# Patient Record
Sex: Male | Born: 1975 | Race: Black or African American | Hispanic: No | Marital: Single | Smoking: Current every day smoker
Health system: Southern US, Community
[De-identification: ages and names within clinical notes are randomized; demographics above are authoritative.]

## PROBLEM LIST (undated history)

## (undated) DIAGNOSIS — I10 Essential (primary) hypertension: Secondary | ICD-10-CM

---

## 1999-02-21 ENCOUNTER — Emergency Department (HOSPITAL_COMMUNITY): Admission: EM | Admit: 1999-02-21 | Discharge: 1999-02-21 | Payer: Self-pay | Admitting: Emergency Medicine

## 2002-05-08 ENCOUNTER — Emergency Department (HOSPITAL_COMMUNITY): Admission: EM | Admit: 2002-05-08 | Discharge: 2002-05-08 | Payer: Self-pay | Admitting: *Deleted

## 2002-05-08 ENCOUNTER — Encounter: Payer: Self-pay | Admitting: Emergency Medicine

## 2002-09-29 ENCOUNTER — Emergency Department (HOSPITAL_COMMUNITY): Admission: EM | Admit: 2002-09-29 | Discharge: 2002-09-29 | Payer: Self-pay

## 2002-09-29 ENCOUNTER — Encounter: Payer: Self-pay | Admitting: Emergency Medicine

## 2002-11-06 ENCOUNTER — Emergency Department (HOSPITAL_COMMUNITY): Admission: EM | Admit: 2002-11-06 | Discharge: 2002-11-06 | Payer: Self-pay | Admitting: Emergency Medicine

## 2002-11-06 ENCOUNTER — Emergency Department (HOSPITAL_COMMUNITY): Admission: EM | Admit: 2002-11-06 | Discharge: 2002-11-07 | Payer: Self-pay | Admitting: Emergency Medicine

## 2002-11-07 ENCOUNTER — Ambulatory Visit (HOSPITAL_BASED_OUTPATIENT_CLINIC_OR_DEPARTMENT_OTHER): Admission: RE | Admit: 2002-11-07 | Discharge: 2002-11-07 | Payer: Self-pay | Admitting: *Deleted

## 2003-11-04 ENCOUNTER — Emergency Department (HOSPITAL_COMMUNITY): Admission: AD | Admit: 2003-11-04 | Discharge: 2003-11-04 | Payer: Self-pay | Admitting: Family Medicine

## 2004-10-07 ENCOUNTER — Emergency Department (HOSPITAL_COMMUNITY): Admission: EM | Admit: 2004-10-07 | Discharge: 2004-10-08 | Payer: Self-pay | Admitting: Emergency Medicine

## 2004-11-15 ENCOUNTER — Emergency Department (HOSPITAL_COMMUNITY): Admission: EM | Admit: 2004-11-15 | Discharge: 2004-11-16 | Payer: Self-pay | Admitting: Emergency Medicine

## 2004-12-02 ENCOUNTER — Emergency Department (HOSPITAL_COMMUNITY): Admission: EM | Admit: 2004-12-02 | Discharge: 2004-12-02 | Payer: Self-pay | Admitting: Family Medicine

## 2005-01-01 ENCOUNTER — Encounter: Payer: Self-pay | Admitting: Emergency Medicine

## 2005-01-01 ENCOUNTER — Inpatient Hospital Stay (HOSPITAL_COMMUNITY): Admission: AD | Admit: 2005-01-01 | Discharge: 2005-01-04 | Payer: Self-pay | Admitting: *Deleted

## 2005-01-01 ENCOUNTER — Ambulatory Visit: Payer: Self-pay | Admitting: Internal Medicine

## 2006-09-14 ENCOUNTER — Emergency Department (HOSPITAL_COMMUNITY): Admission: EM | Admit: 2006-09-14 | Discharge: 2006-09-14 | Payer: Self-pay | Admitting: Family Medicine

## 2006-11-15 ENCOUNTER — Emergency Department (HOSPITAL_COMMUNITY): Admission: EM | Admit: 2006-11-15 | Discharge: 2006-11-15 | Payer: Self-pay | Admitting: Emergency Medicine

## 2006-11-16 ENCOUNTER — Emergency Department (HOSPITAL_COMMUNITY): Admission: EM | Admit: 2006-11-16 | Discharge: 2006-11-16 | Payer: Self-pay | Admitting: Emergency Medicine

## 2006-11-21 ENCOUNTER — Emergency Department (HOSPITAL_COMMUNITY): Admission: EM | Admit: 2006-11-21 | Discharge: 2006-11-21 | Payer: Self-pay

## 2007-02-27 ENCOUNTER — Emergency Department (HOSPITAL_COMMUNITY): Admission: EM | Admit: 2007-02-27 | Discharge: 2007-02-27 | Payer: Self-pay | Admitting: Family Medicine

## 2007-03-05 ENCOUNTER — Emergency Department (HOSPITAL_COMMUNITY): Admission: EM | Admit: 2007-03-05 | Discharge: 2007-03-05 | Payer: Self-pay | Admitting: Emergency Medicine

## 2007-03-06 ENCOUNTER — Emergency Department (HOSPITAL_COMMUNITY): Admission: EM | Admit: 2007-03-06 | Discharge: 2007-03-06 | Payer: Self-pay | Admitting: *Deleted

## 2007-04-18 ENCOUNTER — Emergency Department (HOSPITAL_COMMUNITY): Admission: EM | Admit: 2007-04-18 | Discharge: 2007-04-18 | Payer: Self-pay | Admitting: Emergency Medicine

## 2007-04-27 ENCOUNTER — Emergency Department (HOSPITAL_COMMUNITY): Admission: EM | Admit: 2007-04-27 | Discharge: 2007-04-27 | Payer: Self-pay | Admitting: Emergency Medicine

## 2007-09-14 ENCOUNTER — Emergency Department (HOSPITAL_COMMUNITY): Admission: EM | Admit: 2007-09-14 | Discharge: 2007-09-14 | Payer: Self-pay | Admitting: Emergency Medicine

## 2008-11-20 ENCOUNTER — Emergency Department (HOSPITAL_COMMUNITY): Admission: EM | Admit: 2008-11-20 | Discharge: 2008-11-20 | Payer: Self-pay | Admitting: Emergency Medicine

## 2008-12-14 ENCOUNTER — Emergency Department (HOSPITAL_COMMUNITY): Admission: EM | Admit: 2008-12-14 | Discharge: 2008-12-14 | Payer: Self-pay | Admitting: Emergency Medicine

## 2008-12-16 ENCOUNTER — Emergency Department (HOSPITAL_COMMUNITY): Admission: EM | Admit: 2008-12-16 | Discharge: 2008-12-16 | Payer: Self-pay | Admitting: Emergency Medicine

## 2010-11-22 LAB — GC/CHLAMYDIA PROBE AMP, GENITAL
Chlamydia, DNA Probe: NEGATIVE
GC Probe Amp, Genital: NEGATIVE

## 2010-11-23 LAB — DIFFERENTIAL
Basophils Absolute: 0.1 10*3/uL (ref 0.0–0.1)
Eosinophils Relative: 4 % (ref 0–5)
Lymphocytes Relative: 36 % (ref 12–46)

## 2010-11-23 LAB — URINALYSIS, ROUTINE W REFLEX MICROSCOPIC
Bilirubin Urine: NEGATIVE
Glucose, UA: NEGATIVE mg/dL
Hgb urine dipstick: NEGATIVE
Ketones, ur: NEGATIVE mg/dL
Nitrite: NEGATIVE
Protein, ur: NEGATIVE mg/dL
Specific Gravity, Urine: 1.027 (ref 1.005–1.030)
Urobilinogen, UA: 1 mg/dL (ref 0.0–1.0)
pH: 6 (ref 5.0–8.0)

## 2010-11-23 LAB — CBC
HCT: 48.8 % (ref 39.0–52.0)
Platelets: 288 10*3/uL (ref 150–400)
RDW: 12.8 % (ref 11.5–15.5)

## 2010-11-23 LAB — POCT I-STAT, CHEM 8
BUN: 7 mg/dL (ref 6–23)
Hemoglobin: 18 g/dL — ABNORMAL HIGH (ref 13.0–17.0)
Sodium: 141 mEq/L (ref 135–145)
TCO2: 26 mmol/L (ref 0–100)

## 2010-12-30 NOTE — Discharge Summary (Signed)
NAME:  Paul Osborne, Paul Osborne NO.:  [id number]   MEDICAL RECORD NO.:  [id number]          PATIENT TYPE:  INP   LOCATION:  5702                         FACILITY:  [facility]   PHYSICIAN:  Gertha Calkin, M.D.DATE OF BIRTH:  Jan 04, 1976   DATE OF ADMISSION:  01/01/2005  DATE OF DISCHARGE:  01/04/2005                                 DISCHARGE SUMMARY   PRIMARY CARE PHYSICIAN:  Unassigned.   DISCHARGE DIAGNOSES:  1. Active pulmonary tuberculosis.  2. Polysubstance abuse.  3. Tobacco abuse.     MEDICATIONS:  1. Rifampin 600 mg p.o. daily.  2. Ethambutol 1200 mg p.o. daily.  3. Pyrazinamide 1500 mg p.o. daily.  4. Pyridoxine 50 mg p.o. daily.  5. Isoniazid 300 mg p.o. daily.     FOLLOW UP:  With the [facility] regarding DOT therapy for his  tuberculosis.   DIAGNOSTIC STUDIES:  1. AFB smear 4+ positive for acid fast bacilli.  2. HIV is nonreactive.  3. RPR is nonreactive.  4. Liver function tests were AST 14, ALT less than 8, alkaline phosphatase      73, total bilirubin of 1.2, (normal).  Albumin low at 2.9.     HOSPITAL COURSE:  Please see H&P for details of admission.  1. Active pulmonary tuberculosis.  The patient had x-ray findings for      cavitary lesions and smears as results as noted.  Was empirically      placed in isolation from day one.  Once results came back, he was, ID      was consulted and he was placed on a four drug regimen for active      pulmonary tuberculosis.  I have arranged with, I have asked the case      manager arrange outpatient directly observed therapy that he will need      to have for the next six months.  During this hospitalization, he has      tolerated his medications well and therefore we are setting him up for      this outpatient therapy.   1. Polysubstance abuse.  I have discussed with him the risks involved not      only from the substance but also other co-risks involved.  The patient      voices an  understanding and states that he does, will try to quit.     DISPOSITION:  Stable on discharge.  Vitals are normal.  His discharge  laboratories were all within normal limits except for the albumin as listed  above.   FOLLOW UP:  Follow up is to be with Dr. Orvan Falconer in six to eight weeks.  Will give the patient phone numbers and have him make that call to set up  the appointment.  This can also be coordinated with the health clinic if  problems were to arise with his drug regimen.      JD/MEDQ  D:  01/04/2005  T:  01/04/2005  Job:  [id number]   cc:   Orvan Falconer, M.D.   Attention [facility]  [place]
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
## 2010-12-30 NOTE — H&P (Signed)
NAME:  Paul Osborne, Paul Osborne NO.:  [id number]   MEDICAL RECORD NO.:  [id number]          PATIENT TYPE:  INP   LOCATION:  NA                           FACILITY:  [facility]   PHYSICIAN:  Hettie Holstein, D.O.    DATE OF BIRTH:  05/15/1976   DATE OF ADMISSION:  01/01/2005  DATE OF DISCHARGE:                                HISTORY & PHYSICAL   PRIMARY. CARE PHYSICIAN:  Unassigned.   CHIEF COMPLAINT:  I passed out on Friday.   HISTORY OF PRESENT ILLNESS:  Mr. Viviano is a 35 year old African-American  male who has no known chronic medical illnesses.  He does, however, have a  history of polysubstance abuse.  He reports that on Friday he had been at a  concert, drinking a little bit and smoking some marijuana, and passed out  for about 5 seconds.  His family reports that he was on the floor, bumping  his head on the hard wood.  He went home and rested and continued to feel  lightheaded.  He denies any fevers; however, he does report positive night  sweats for quite some time now.  He has had no nausea, vomiting, diarrhea,  chest pain, or shortness of breath.  He has had an unproductive cough,  though he does have a chronic cough which he states is due to bronchitis  that he has been told he has.   In the emergency department, he had a chest x-ray that revealed cavitary  lesions in the upper lobes concerning for tuberculosis; therefore, he was  placed on isolation and is being admitted for TB rule out.   PAST MEDICAL HISTORY:  1.  Polysubstance abuse.  2.  History of bronchitis.  3.  Had abdominal surgery as an infant, but he cannot recall the nature of      this surgery.   MEDICATIONS:  None.   ALLERGIES:  No known drug allergies.   SOCIAL HISTORY:  He is a one-pack-every-three-days smoker.  He drinks  alcohol on the weekends.  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.  He denies crack cocaine, lives alone, and states that he  is single and  monogamous.  He was recently tested for HIV within the past  four months.   FAMILY HISTORY:  Gearldine Shown is 61 with MI.  His mother is 32 years old and  has cervical cancer.  He is not aware of his father's medical history.   REVIEW OF SYSTEMS:  The patient denies any headache.  He states that he does  have some lightheadedness; however, reports that he passed out during a  concert that lasted a few seconds but has not passed out since Friday.  Had  no chest pain, shortness of breath, palpitations.  Otherwise, he has had no  further complaints.  Further Review of Systems unremarkable.  He denies  travel out of the country.  He denies sick contacts.  Denies working in the  health care field.   PHYSICAL EXAMINATION:  GENERAL:  The patient is alert, thin male in no acute  distress.  He is alert and oriented x 3.  VITAL SIGNS:  Stable.  He is afebrile.  CARDIOVASCULAR:  Normal S1 and S2 without murmur or gallop.  PULMONARY:  Normal breath sounds bilaterally, normal effort.  No dullness to  percussion.  ABDOMEN: Soft, nontender.  EXTREMITIES:  No edema.   ASSESSMENT:  1.  Cavitary lesions on chest x-ray, atypical pneumonia.  Rule out      tuberculosis.  2.  Polysubstance abuse.  3.  Lightheadedness.   PLAN:  1.  At this time, we are going to admit Mr. Michels to a respiratory      isolation room.  2.  Rule out TB.  3.  Empirically start him on Avelox to cover atypical sources of pneumonia.   I discussed chest x-ray findings with radiology and will likely follow up  with a chest CT within the next week if he rules out for tuberculosis.      ESS/MEDQ  D:  01/01/2005  T:  01/01/2005  Job:  [id number]

## 2011-05-29 LAB — WOUND CULTURE
Culture: NO GROWTH
Gram Stain: NONE SEEN

## 2011-05-29 LAB — CULTURE, ROUTINE-ABSCESS
Culture: NO GROWTH
Gram Stain: NONE SEEN

## 2011-10-17 ENCOUNTER — Encounter (HOSPITAL_COMMUNITY): Payer: Self-pay | Admitting: Emergency Medicine

## 2011-10-17 ENCOUNTER — Emergency Department (INDEPENDENT_AMBULATORY_CARE_PROVIDER_SITE_OTHER)
Admission: EM | Admit: 2011-10-17 | Discharge: 2011-10-17 | Disposition: A | Discharge status: Home / Self Care | Payer: Self-pay | Source: Home / Self Care | Attending: Emergency Medicine | Admitting: Emergency Medicine

## 2011-10-17 DIAGNOSIS — K5289 Other specified noninfective gastroenteritis and colitis: Secondary | ICD-10-CM

## 2011-10-17 DIAGNOSIS — K529 Noninfective gastroenteritis and colitis, unspecified: Secondary | ICD-10-CM

## 2011-10-17 MED ORDER — ONDANSETRON HCL 4 MG PO TABS: 4.0000 mg | ORAL_TABLET | Freq: Three times a day (TID) | ORAL | Status: AC | PRN

## 2011-10-17 MED ORDER — ACIDOPHILUS PROBIOTIC BLEND PO CAPS: 1.0000 | ORAL_CAPSULE | Freq: Three times a day (TID) | ORAL | Status: DC

## 2011-10-17 MED ORDER — DIPHENOXYLATE-ATROPINE 2.5-0.025 MG PO TABS: 1.0000 | ORAL_TABLET | Freq: Four times a day (QID) | ORAL | Status: AC | PRN

## 2011-10-17 NOTE — ED Notes (Signed)
Report to scarlet, rn

## 2011-10-17 NOTE — Discharge Instructions (Signed)
Go to www.goodrx.com to look up your medications. This will give you a list of where you can find your prescriptions at the most affordable prices.   RESOURCE GUIDE  Insufficient Money for Medicine Contact [employer]:  call "211" or [facility] [phone].  No Primary Care Doctor Call [facility]  [phone] Other agencies that provide inexpensive medical care    [facility]  [phone]    [facility]  [phone]    [facility]  [phone]    [facility]  [phone] [address] [place] [address]    [facility]  [phone]    [facility]  [phone] [facility] [phone]   [address] [place] [address] [place], [place] [address]   [facility]  [facility]     [employer]                          [facility] Dept. [address] [place]                       [address]      [address] [place] [address]   [phone] (After Hours)

## 2011-10-17 NOTE — ED Notes (Signed)
Vomited 5 different times today, diarrhea today, about 4 episodes.  C/o low abdominal pain.  Other family members diagnosed with virus.

## 2011-10-17 NOTE — ED Provider Notes (Signed)
History     CSN: [id number]  Arrival date & time 10/17/11  9562   First MD Initiated Contact with Patient 10/17/11 2037      Chief Complaint  Patient presents with  . Emesis    (Consider location/radiation/quality/duration/timing/severity/associated sxs/prior treatment) HPI Comments: Patient with diffuse, crampy abdominal pain followed by multiple episodes of nbnb emesis and nonbloody, watery diarrhea. States abdominal pain resolves after vomiting/having diarrhea. Reports generalized weakness, and no presyncope, syncope. No fevers, urinary complaints, decreased urine output. Patient has multiple family members with similar illnesses. No recent travel, raw/undercooked foods, questionable leftovers, recent antibiotics, alcohol intake.   ROS as noted in HPI. All other ROS negative.   Patient is a 36 y.o. male presenting with vomiting. The history is provided by the patient. No language interpreter was used.  Emesis  This is a new problem. The current episode started 6 to 12 hours ago. The problem occurs 5 to 10 times per day. The problem has not changed since onset.The emesis has an appearance of stomach contents. There has been no fever. Associated symptoms include abdominal pain and diarrhea. Pertinent negatives include no arthralgias, no chills, no fever, no headaches and no myalgias. Risk factors include ill contacts.    History reviewed. No pertinent past medical history.  History reviewed. No pertinent past surgical history.  No family history on file.  History  Substance Use Topics  . Smoking status: Current Everyday Smoker  . Smokeless tobacco: Not on file  . Alcohol Use: No      Review of Systems  Constitutional: Negative for fever and chills.  Gastrointestinal: Positive for vomiting, abdominal pain and diarrhea.  Musculoskeletal: Negative for myalgias and arthralgias.  Neurological: Negative for headaches.    Allergies  Review of patient's allergies indicates no  known allergies.  Home Medications   Current Outpatient Rx  Name Route Sig Dispense Refill  . DIPHENOXYLATE-ATROPINE 2.5-0.025 MG PO TABS Oral Take 1 tablet by mouth 4 (four) times daily as needed for diarrhea or loose stools. 30 tablet 0  . ONDANSETRON HCL 4 MG PO TABS Oral Take 1 tablet (4 mg total) by mouth every 8 (eight) hours as needed for nausea. 20 tablet 0  . ACIDOPHILUS PROBIOTIC BLEND PO CAPS Oral Take 1 capsule by mouth 3 (three) times daily. 30 capsule 0    BP 142/90  Pulse 94  Resp 16  SpO2 98%  Physical Exam  Nursing note and vitals reviewed. Constitutional: He is oriented to person, place, and time. He appears well-developed and well-nourished.  HENT:  Head: Normocephalic and atraumatic.  Eyes: Conjunctivae and EOM are normal. No scleral icterus.  Neck: Normal range of motion.  Cardiovascular: Normal rate, regular rhythm, normal heart sounds and intact distal pulses.        Refill less than 2 seconds  Pulmonary/Chest: Effort normal and breath sounds normal.  Abdominal: Soft. Normal appearance and bowel sounds are normal. He exhibits no distension. There is no tenderness. There is no rebound, no guarding and no CVA tenderness.  Musculoskeletal: Normal range of motion.  Neurological: He is alert and oriented to person, place, and time.  Skin: Skin is warm and dry.  Psychiatric: He has a normal mood and affect. His behavior is normal.    ED Course  Procedures (including critical care time)  Labs Reviewed - No data to display No results found.   1. Gastroenteritis       MDM  Abdomen benign, patient appears well hydrated. Sending home with  Zofran, Lomotil, oral rehydration.  Luiz Blare, MD 10/17/11 2306

## 2012-08-14 ENCOUNTER — Emergency Department (HOSPITAL_COMMUNITY)
Admission: EM | Admit: 2012-08-14 | Discharge: 2012-08-14 | Disposition: A | Discharge status: Home / Self Care | Payer: Self-pay | Attending: Emergency Medicine | Admitting: Emergency Medicine

## 2012-08-14 ENCOUNTER — Encounter (HOSPITAL_COMMUNITY): Payer: Self-pay | Admitting: Emergency Medicine

## 2012-08-14 DIAGNOSIS — F17200 Nicotine dependence, unspecified, uncomplicated: Secondary | ICD-10-CM | POA: Insufficient documentation

## 2012-08-14 DIAGNOSIS — K047 Periapical abscess without sinus: Secondary | ICD-10-CM | POA: Insufficient documentation

## 2012-08-14 MED ORDER — OXYCODONE-ACETAMINOPHEN 5-325 MG PO TABS
1.0000 | ORAL_TABLET | ORAL | Status: DC | PRN
Start: 2012-08-14 — End: 2013-09-08

## 2012-08-14 MED ORDER — PENICILLIN V POTASSIUM 500 MG PO TABS: 500.0000 mg | ORAL_TABLET | Freq: Four times a day (QID) | ORAL | Status: AC

## 2012-08-14 MED ORDER — OXYCODONE-ACETAMINOPHEN 5-325 MG PO TABS
1.0000 | ORAL_TABLET | Freq: Once | ORAL | Status: AC
  Administered 2012-08-14: 1 via ORAL
  Filled 2012-08-14: qty 1

## 2012-08-14 NOTE — ED Provider Notes (Signed)
History     CSN: [id number]  Arrival date & time 08/14/12  0309   First MD Initiated Contact with Patient 08/14/12 [phone]      Chief Complaint  Patient presents with  . Dental Pain     Patient is a 37 y.o. male presenting with tooth pain. The history is provided by the patient.  Dental PainThe primary symptoms include mouth pain. Primary symptoms do not include fever. Episode onset: several days ago. The symptoms are worsening. The symptoms are new. The symptoms occur constantly.    PMH - none  History reviewed. No pertinent past surgical history.  No family history on file.  History  Substance Use Topics  . Smoking status: Current Every Day Smoker  . Smokeless tobacco: Not on file  . Alcohol Use: No      Review of Systems  Constitutional: Negative for fever.  Gastrointestinal: Negative for vomiting.    Allergies  Review of patient's allergies indicates no known allergies.  Home Medications   Current Outpatient Rx  Name  Route  Sig  Dispense  Refill  . OXYCODONE-ACETAMINOPHEN 5-325 MG PO TABS   Oral   Take 1 tablet by mouth every 4 (four) hours as needed for pain.   5 tablet   0   . PENICILLIN V POTASSIUM 500 MG PO TABS   Oral   Take 1 tablet (500 mg total) by mouth 4 (four) times daily.   40 tablet   0     BP 142/95  Pulse 99  Temp 97.7 F (36.5 C) (Oral)  Resp 18  SpO2 100%  Physical Exam CONSTITUTIONAL: Well developed/well nourished HEAD AND FACE: Normocephalic/atraumatic EYES: EOMI/PERRL ENMT: Mucous membranes moist.  Poor dentition.  No trismus.  Small abscess noted to left lower molar NECK: supple no meningeal signs CV: S1/S2 noted, no murmurs/rubs/gallops noted LUNGS: Lungs are clear to auscultation bilaterally, no apparent distress ABDOMEN: soft, nontender, no rebound or guarding NEURO: Pt is awake/alert, moves all extremitiesx4 EXTREMITIES:full ROM SKIN: warm, color normal  ED Course  Procedures   1. Dental abscess       MDM   Nursing notes including past medical history and social history reviewed and considered in documentation  Start PCN and f/u with dental, pt agreed        Joya Gaskins, MD 08/14/12 [phone]

## 2012-08-14 NOTE — ED Notes (Signed)
PT. REPORTS LEFT LOWER MOLAR PAIN FOR SEVERAL DAYS.

## 2012-08-14 NOTE — ED Notes (Signed)
Pt discharged.Vital signs stable .GCS 15

## 2012-08-31 ENCOUNTER — Emergency Department (HOSPITAL_COMMUNITY): Payer: No Typology Code available for payment source

## 2012-08-31 ENCOUNTER — Emergency Department (HOSPITAL_COMMUNITY)
Admission: EM | Admit: 2012-08-31 | Discharge: 2012-08-31 | Disposition: A | Discharge status: Home / Self Care | Payer: No Typology Code available for payment source | Attending: Emergency Medicine | Admitting: Emergency Medicine

## 2012-08-31 ENCOUNTER — Encounter (HOSPITAL_COMMUNITY): Payer: Self-pay | Admitting: *Deleted

## 2012-08-31 DIAGNOSIS — S025XXA Fracture of tooth (traumatic), initial encounter for closed fracture: Secondary | ICD-10-CM | POA: Insufficient documentation

## 2012-08-31 DIAGNOSIS — Y9389 Activity, other specified: Secondary | ICD-10-CM | POA: Insufficient documentation

## 2012-08-31 DIAGNOSIS — Z23 Encounter for immunization: Secondary | ICD-10-CM | POA: Insufficient documentation

## 2012-08-31 DIAGNOSIS — R413 Other amnesia: Secondary | ICD-10-CM | POA: Insufficient documentation

## 2012-08-31 DIAGNOSIS — S60512A Abrasion of left hand, initial encounter: Secondary | ICD-10-CM

## 2012-08-31 DIAGNOSIS — F17200 Nicotine dependence, unspecified, uncomplicated: Secondary | ICD-10-CM | POA: Insufficient documentation

## 2012-08-31 DIAGNOSIS — Y92410 Unspecified street and highway as the place of occurrence of the external cause: Secondary | ICD-10-CM | POA: Insufficient documentation

## 2012-08-31 DIAGNOSIS — S161XXA Strain of muscle, fascia and tendon at neck level, initial encounter: Secondary | ICD-10-CM

## 2012-08-31 DIAGNOSIS — S139XXA Sprain of joints and ligaments of unspecified parts of neck, initial encounter: Secondary | ICD-10-CM | POA: Insufficient documentation

## 2012-08-31 DIAGNOSIS — IMO0002 Reserved for concepts with insufficient information to code with codable children: Secondary | ICD-10-CM | POA: Insufficient documentation

## 2012-08-31 MED ORDER — OXYCODONE-ACETAMINOPHEN 5-325 MG PO TABS: 1.0000 | ORAL_TABLET | ORAL | Status: DC | PRN

## 2012-08-31 MED ORDER — FLUORESCEIN SODIUM 1 MG OP STRP
1.0000 | ORAL_STRIP | Freq: Once | OPHTHALMIC | Status: AC
  Administered 2012-08-31: 1 via OPHTHALMIC
  Filled 2012-08-31: qty 1

## 2012-08-31 MED ORDER — OXYCODONE-ACETAMINOPHEN 5-325 MG PO TABS
2.0000 | ORAL_TABLET | Freq: Once | ORAL | Status: AC
  Administered 2012-08-31: 2 via ORAL
  Filled 2012-08-31: qty 2

## 2012-08-31 MED ORDER — TETRACAINE HCL 0.5 % OP SOLN
1.0000 [drp] | Freq: Once | OPHTHALMIC | Status: AC
  Administered 2012-08-31: 1 [drp] via OPHTHALMIC
  Filled 2012-08-31: qty 2

## 2012-08-31 MED ORDER — NAPROXEN 500 MG PO TABS: 500.0000 mg | ORAL_TABLET | Freq: Two times a day (BID) | ORAL | Status: DC

## 2012-08-31 MED ORDER — TETANUS-DIPHTH-ACELL PERTUSSIS 5-2.5-18.5 LF-MCG/0.5 IM SUSP
0.5000 mL | Freq: Once | INTRAMUSCULAR | Status: AC
  Administered 2012-08-31: 0.5 mL via INTRAMUSCULAR
  Filled 2012-08-31: qty 0.5

## 2012-08-31 MED ORDER — PENICILLIN V POTASSIUM 500 MG PO TABS: 500.0000 mg | ORAL_TABLET | Freq: Four times a day (QID) | ORAL | Status: DC

## 2012-08-31 NOTE — ED Notes (Signed)
Dr. Hyacinth Meeker removed pt off spine board and C-Collar. Pt denies pain. Pt ambulatory with steady gait to restroom to wash face and hands
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
## 2012-08-31 NOTE — ED Notes (Signed)
Patient transported to CT and XR 
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
## 2013-09-08 ENCOUNTER — Encounter (HOSPITAL_COMMUNITY): Payer: Self-pay | Admitting: Emergency Medicine

## 2013-09-08 ENCOUNTER — Emergency Department (HOSPITAL_COMMUNITY)
Admission: EM | Admit: 2013-09-08 | Discharge: 2013-09-08 | Disposition: A | Discharge status: Home / Self Care | Payer: No Typology Code available for payment source | Attending: Emergency Medicine | Admitting: Emergency Medicine

## 2013-09-08 DIAGNOSIS — J329 Chronic sinusitis, unspecified: Secondary | ICD-10-CM | POA: Insufficient documentation

## 2013-09-08 DIAGNOSIS — R51 Headache: Secondary | ICD-10-CM

## 2013-09-08 DIAGNOSIS — F17200 Nicotine dependence, unspecified, uncomplicated: Secondary | ICD-10-CM | POA: Insufficient documentation

## 2013-09-08 DIAGNOSIS — R519 Headache, unspecified: Secondary | ICD-10-CM

## 2013-09-08 MED ORDER — CETIRIZINE HCL 10 MG PO TABS: 10.0000 mg | ORAL_TABLET | Freq: Every day | ORAL | Status: DC

## 2013-09-08 MED ORDER — KETOROLAC TROMETHAMINE 60 MG/2ML IM SOLN
60.0000 mg | Freq: Once | INTRAMUSCULAR | Status: AC
  Administered 2013-09-08: 60 mg via INTRAMUSCULAR
  Filled 2013-09-08: qty 2

## 2013-09-08 MED ORDER — NAPROXEN 500 MG PO TABS: 500.0000 mg | ORAL_TABLET | Freq: Two times a day (BID) | ORAL | Status: DC

## 2013-09-08 MED ORDER — AMOXICILLIN 500 MG PO CAPS: 1000.0000 mg | ORAL_CAPSULE | Freq: Three times a day (TID) | ORAL | Status: DC

## 2013-09-08 NOTE — ED Provider Notes (Signed)
CSN: [id number]     Arrival date & time 09/08/13  0230 History   First MD Initiated Contact with Patient 09/08/13 0319     Chief Complaint  Patient presents with  . Headache   (Consider location/radiation/quality/duration/timing/severity/associated sxs/prior Treatment) HPI Comments: 38 year old male presents with approximately 3 days of gradual onset of pressure and fullness she feels in the left forehead, left cheek radiating to the left temporal area and the left neck. There is no associated fevers but he has had nasal congestion and some decreased hearing in the left ear. He denies fevers chills nausea or vomiting. Symptoms are persistent, does not get better with over-the-counter medications.  Patient is a 38 y.o. male presenting with headaches. The history is provided by the patient.  Headache   History reviewed. No pertinent past medical history. History reviewed. No pertinent past surgical history. No family history on file. History  Substance Use Topics  . Smoking status: Current Every Day Smoker  . Smokeless tobacco: Not on file  . Alcohol Use: No    Review of Systems  Neurological: Positive for headaches.  All other systems reviewed and are negative.    Allergies  Review of patient's allergies indicates no known allergies.  Home Medications   Current Outpatient Rx  Name  Route  Sig  Dispense  Refill  . amoxicillin (AMOXIL) 500 MG capsule   Oral   Take 2 capsules (1,000 mg total) by mouth 3 (three) times daily.   60 capsule   0   . cetirizine (ZYRTEC ALLERGY) 10 MG tablet   Oral   Take 1 tablet (10 mg total) by mouth daily.   30 tablet   1   . naproxen (NAPROSYN) 500 MG tablet   Oral   Take 1 tablet (500 mg total) by mouth 2 (two) times daily with a meal.   30 tablet   0    BP 136/91  Pulse 57  Temp(Src) 98.1 F (36.7 C) (Oral)  Resp 17  Ht 6\' 8"  (2.032 m)  Wt 163 lb (73.936 kg)  BMI 17.91 kg/m2  SpO2 99% Physical Exam  Nursing note and  vitals reviewed. Constitutional: He appears well-developed and well-nourished. No distress.  HENT:  Head: Normocephalic and atraumatic.  Mouth/Throat: Oropharynx is clear and moist. No oropharyngeal exudate.  Tenderness to palpation over left frontal and maxillary sinuses, nasal passages with swollen turbinates but no discharge, oropharynx clear, tympanic membranes visualized and normal bilaterally  Eyes: Conjunctivae and EOM are normal. Pupils are equal, round, and reactive to light. Right eye exhibits no discharge. Left eye exhibits no discharge. No scleral icterus.  Neck: Normal range of motion. Neck supple. No JVD present. No thyromegaly present.  Cardiovascular: Normal rate, regular rhythm, normal heart sounds and intact distal pulses.  Exam reveals no gallop and no friction rub.   No murmur heard. Pulmonary/Chest: Effort normal and breath sounds normal. No respiratory distress. He has no wheezes. He has no rales.  Abdominal: Soft. Bowel sounds are normal. He exhibits no distension and no mass. There is no tenderness.  Musculoskeletal: Normal range of motion. He exhibits no edema and no tenderness.  Lymphadenopathy:    He has no cervical adenopathy.  Neurological: He is alert. Coordination normal.  Speech is clear, cranial nerves III through XII are intact, memory is intact, strength is normal in all 4 extremities including grips, sensation is intact to light touch and pinprick in all 4 extremities. Correlation normal, no limb ataxia. Normal gait,  Skin: Skin is warm and dry. No rash noted. No erythema.  Psychiatric: He has a normal mood and affect. His behavior is normal.    ED Course  Procedures (including critical care time) Labs Review Labs Reviewed - No data to display Imaging Review No results found.  EKG Interpretation   None       MDM   1. Sinusitis   2. Headache    Normal vital signs except for mild hypertension, likely sinusitis, medications as below, patient  appears stable without any focal neurologic deficits or need for further invasive testing.   Meds given in ED:  Medications  ketorolac (TORADOL) injection 60 mg (not administered)    New Prescriptions   AMOXICILLIN (AMOXIL) 500 MG CAPSULE    Take 2 capsules (1,000 mg total) by mouth 3 (three) times daily.   CETIRIZINE (ZYRTEC ALLERGY) 10 MG TABLET    Take 1 tablet (10 mg total) by mouth daily.   NAPROXEN (NAPROSYN) 500 MG TABLET    Take 1 tablet (500 mg total) by mouth 2 (two) times daily with a meal.        Vida Roller, MD 09/08/13 [phone]

## 2013-09-08 NOTE — ED Notes (Signed)
Pt verbalized understanding of discharge instructions and the need for follow up care outside the emergency room per the resource list provided.

## 2013-09-08 NOTE — ED Notes (Signed)
C/o headache with blurred vision and L eye pain x 3 days.  States when headache gets worse he also has nausea.  No history of headaches.  No numbness/weakness.

## 2013-09-08 NOTE — ED Notes (Signed)
Pt states "I have a headache that radiates down the Left side of my head toward my neck that started 3 days ago".  Pt states he has nausea that comes and goes.  Pain is 9/10.

## 2013-09-08 NOTE — Discharge Instructions (Signed)
Headache:  You are having a headache. No specific cause was found today for your headache. It may have been a migraine or other cause of headache. Stress, anxiety, fatigue, and depression are common triggers for headaches. Your headache today does not appear to be life-threatening or require hospitalization, but often the exact cause of headaches is not determined in the emergency department. Therefore, followup with your doctor is very important to find out what may have caused your headache, and whether or not you need any further diagnostic testing or treatment. Sometimes headaches can appear benign but then more serious symptoms can develop which should prompt an immediate reevaluation by your doctor or the emergency department.  Seek immediate medical attention if:  You develop possible problems with medications prescribed. The medications don't resolve your headache, if it recurs, or if you have multiple episodes of vomiting or can't take fluids by mouth You have a change from the usual headache. If you developed a sudden severe headache or confusion, become poorly responsive or faint, developed a fever above 100.4 or problems breathing, have a change in speech, vision, swallowing or understanding, or developed new weakness, numbness, tingling, incoordination or have a seizure.  If you don't have a family doctor to follow up with, see the follow up list below - call this morning for a follow-up appointment in the next 1-2 days.  RESOURCE GUIDE  Dental Problems  Patients with Medicaid: [facility]                     [facility] [address]                                           [address] Phone:  [phone]                                                  Phone:  [phone]  If unable to pay or uninsured, contact:  [facility] or [facility] to become qualified for the adult dental clinic.  Chronic Pain Problems Contact [facility]  [phone] Patients need to be referred by their primary care doctor.  Insufficient Money for Medicine Contact [employer]:  call "211" or [facility] 271-5999.  No Primary Care Doctor Call [facility]  832-8000 Other agencies that provide inexpensive medical care    [facility]  832-8035    [facility]  832-7272    [facility]  [phone]    Women's Clinic  832-4777    [facility]  [phone]    [facility]  [phone]  Psychological Services [facility]  [phone] [facility]  [phone] [facility]   800 853-5163 (emergency services 641-4993)  Substance Abuse Resources [facility]  336-882-2125 [facility] 336-784-9470 [facility] 336-285-9073 [facility] 336-845-3988 Residential & Outpatient Substance Abuse Program  800-659-3381  Abuse/Neglect [place] Child Abuse Hotline (336) 641-3795 [place] Child Abuse Hotline 800-378-5315 (After Hours)  Emergency Shelter [facility] (336) 271-5985  [facility] (336) 275-9566 [facility] (704) 372-4663  MRSA Hotline #:     832-7006    [place] Resources  [facility] [place]     [employer]                          [facility] [address] [place]                       [address]      [address]  [place]                                                [place]                            [place] Phone:  [phone]                                   Phone:  [phone]                 Phone:  [phone]  [facility] Phone:  [phone]  [place] Child Abuse Hotline (336) 342-1394 (336) 342-3537 (After Hours)   

## 2015-01-23 ENCOUNTER — Encounter (HOSPITAL_COMMUNITY): Payer: Self-pay | Admitting: Emergency Medicine

## 2015-01-23 ENCOUNTER — Emergency Department (HOSPITAL_COMMUNITY)
Admission: EM | Admit: 2015-01-23 | Discharge: 2015-01-23 | Disposition: A | Discharge status: Home / Self Care | Payer: Self-pay | Attending: Emergency Medicine | Admitting: Emergency Medicine

## 2015-01-23 DIAGNOSIS — K0889 Other specified disorders of teeth and supporting structures: Secondary | ICD-10-CM

## 2015-01-23 DIAGNOSIS — K0381 Cracked tooth: Secondary | ICD-10-CM | POA: Insufficient documentation

## 2015-01-23 DIAGNOSIS — Z79899 Other long term (current) drug therapy: Secondary | ICD-10-CM | POA: Insufficient documentation

## 2015-01-23 DIAGNOSIS — Z791 Long term (current) use of non-steroidal anti-inflammatories (NSAID): Secondary | ICD-10-CM | POA: Insufficient documentation

## 2015-01-23 DIAGNOSIS — Z720 Tobacco use: Secondary | ICD-10-CM | POA: Insufficient documentation

## 2015-01-23 DIAGNOSIS — K088 Other specified disorders of teeth and supporting structures: Secondary | ICD-10-CM | POA: Insufficient documentation

## 2015-01-23 MED ORDER — OXYCODONE-ACETAMINOPHEN 5-325 MG PO TABS
2.0000 | ORAL_TABLET | Freq: Once | ORAL | Status: AC
Start: 2015-01-23 — End: 2015-01-23
  Administered 2015-01-23: 2 via ORAL
  Filled 2015-01-23: qty 2

## 2015-01-23 MED ORDER — AMOXICILLIN 500 MG PO CAPS: 500.0000 mg | ORAL_CAPSULE | Freq: Three times a day (TID) | ORAL | Status: DC

## 2015-01-23 MED ORDER — OXYCODONE-ACETAMINOPHEN 5-325 MG PO TABS: 2.0000 | ORAL_TABLET | ORAL | Status: DC | PRN

## 2015-01-23 NOTE — ED Notes (Signed)
Pt. reports right upper molar pain onset this week unrelieved by OTC Ibuprofen .
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
## 2015-01-23 NOTE — Discharge Instructions (Signed)
Dental Pain °A tooth ache may be caused by cavities (tooth decay). Cavities expose the nerve of the tooth to air and hot or cold temperatures. It may come from an infection or abscess (also called a boil or furuncle) around your tooth. It is also often caused by dental caries (tooth decay). This causes the pain you are having. °DIAGNOSIS  °Your caregiver can diagnose this problem by exam. °TREATMENT  °· If caused by an infection, it may be treated with medications which kill germs (antibiotics) and pain medications as prescribed by your caregiver. Take medications as directed. °· Only take over-the-counter or prescription medicines for pain, discomfort, or fever as directed by your caregiver. °· Whether the tooth ache today is caused by infection or dental disease, you should see your dentist as soon as possible for further care. °SEEK MEDICAL CARE IF: °The exam and treatment you received today has been provided on an emergency basis only. This is not a substitute for complete medical or dental care. If your problem worsens or new problems (symptoms) appear, and you are unable to meet with your dentist, call or return to this location. °SEEK IMMEDIATE MEDICAL CARE IF:  °· You have a fever. °· You develop redness and swelling of your face, jaw, or neck. °· You are unable to open your mouth. °· You have severe pain uncontrolled by pain medicine. °MAKE SURE YOU:  °· Understand these instructions. °· Will watch your condition. °· Will get help right away if you are not doing well or get worse. °Document Released: 07/31/2005 Document Revised: 10/23/2011 Document Reviewed: 03/18/2008 °ExitCare® Patient Information ©2015 [employer]. This information is not intended to replace advice given to you by your health care provider. Make sure you discuss any questions you have with your health care provider. ° °Emergency Department Resource Guide °1) Find a Doctor and Pay Out of Pocket °Although you won't have to find out who  is covered by your insurance plan, it is a good idea to ask around and get recommendations. You will then need to call the office and see if the doctor you have chosen will accept you as a new patient and what types of options they offer for patients who are self-pay. Some doctors offer discounts or will set up payment plans for their patients who do not have insurance, but you will need to ask so you aren't surprised when you get to your appointment. ° °2) Contact Your Local Health Department °Not all health departments have doctors that can see patients for sick visits, but many do, so it is worth a call to see if yours does. If you don't know where your local health department is, you can check in your phone book. The CDC also has a tool to help you locate your state's health department, and many state websites also have listings of all of their local health departments. ° °3) Find a Walk-in Clinic °If your illness is not likely to be very severe or complicated, you may want to try a walk in clinic. These are popping up all over the country in pharmacies, drugstores, and shopping centers. They're usually staffed by nurse practitioners or physician assistants that have been trained to treat common illnesses and complaints. They're usually fairly quick and inexpensive. However, if you have serious medical issues or chronic medical problems, these are probably not your best option. ° °No Primary Care Doctor: °- Call [facility] at  832-8000 - they can help you locate a primary   care doctor that  accepts your insurance, provides certain services, etc. °- Physician Referral Service- 1-800-533-3463 ° °Chronic Pain Problems: °Organization         Address  Phone   Notes  °[facility]  (336) 297-2271 Patients need to be referred by their primary care doctor.  ° °Medication Assistance: °Organization         Address  Phone   Notes  °[place] [facility] [address]  [address] °[place], [place] [address] (336) 641-8030 --Must be a resident of [place] °-- Must have NO insurance coverage whatsoever (no Medicaid/ Medicare, etc.) °-- The pt. MUST have a primary care doctor that directs their care regularly and follows them in the community °  °[facility]  (866) 331-1348   °[employer]  (888) 892-1162   ° °Agencies that provide inexpensive medical care: °Organization         Address  Phone   Notes  °[facility]  (336) 832-8035   °[facility]    (336) 832-7272   °[facility] [address] °[place], [place] [address] (336) 832-4777   °[facility] [address], °[place] (336) 271-4999   °[facility]    (336) 373-0678   °[facility]    (336) 272-1050   °[facility] ° [address], [place] Phone:  (336) 832-4444, Fax:  (336) 832-4440 Hours of Operation:  9 am - 6 pm, M-F.  Also accepts Medicaid/Medicare and self-pay.  °[facility] ° [address], [place] Phone: (336) 832-3150, Fax: (336) 832-3151. Hours of Operation:  8:30 am - 5:30 pm, M-F.  Also accepts Medicaid and self-pay.  °[facility] [place] [address], [place] Phone: (336) 878-6027   °[facility] [address], [place], [place] (336)723-1848, Ext. 123 Mondays & Thursdays: 7-9 AM.  First 15 patients are seen on a first come, first serve basis. °  ° °Medicaid-accepting [place] Providers: ° °Organization         Address  Phone   Notes  °[facility] [address], [place] (336) 641-2100 Also accepts self-pay patients.  °[facility] [address], [place] ° (336) 856-9996   °[facility] [address], [place] (336) 288-8857   °[facility] [address] [place] [address], [place] (336) 299-7000   °Veita Bland [address], [place]  ° (336) 373-1557 Only accepts [place] Access Medicaid patients after they have their name applied to their card.  ° °Self-Pay (no insurance) in [place]: ° °Organization         Address  Phone   Notes  °Sickle Cell Patients, [facility] [address], [place] (336) 832-1970   °[facility] [address], [place] (336) 832-4400   °[facility] ° [address], [place] (336) 992-4800   °[facility]/Dr. Osei-Bonsu ° [address] [place] [address], [place] or [address], [place] (336) 841-8500 Phone number for both [place] and [place] locations is the same.  °[facility] [address], [place] (336) 299-0000   °[facility] [address] [place] [address], [place] or [address] (336) 852-7530 °(336) 878-2260   °  [facility] [address], [place] (336) 350-1642, phone; (336) 294-5005, fax Sees patients 1st and 3rd Saturday of every month.  Must not qualify for public or private insurance (i.e. Medicaid, Medicare, [place] Health Choice, Veterans' Benefits) • Household income should be no more than 200% of the poverty level •The clinic cannot treat you if you are pregnant or think you are pregnant • Sexually transmitted diseases are not treated at the clinic.  ° ° °Dental Care: °Organization         Address  Phone  Notes  °[facility] [address], [place] (336) 641-6152 Accepts children up to age 21 who are enrolled in Medicaid or [place] Health Choice; pregnant women with a Medicaid card; and children who have applied for Medicaid or [place] Health Choice, but were declined, whose parents can pay a reduced fee at time of service.  °[facility] [place]  [address], [place] (336) 641-7733 Accepts children up to age 21 who are  enrolled in Medicaid or [place] Health Choice; pregnant women with a Medicaid card; and children who have applied for Medicaid or [place] Health Choice, but were declined, whose parents can pay a reduced fee at time of service.  °[facility] ° [address], [place] (336) 641-4533 Patients are seen by appointment only. Walk-ins are not accepted. [facility] will see patients 18 years of age and older. °Monday - Tuesday (8am-5pm) °Most Wednesdays (8:30-5pm) °$30 per visit, cash only  °[facility] ° [address], [place] (336) 641-4533 Patients are seen by appointment only. Walk-ins are not accepted. [facility] will see patients 18 years of age and older. °One Wednesday Evening (Monthly: Volunteer Based).  $30 per visit, cash only  °[facility]  (919) 537-3737 for adults; Children under age 4, call Graduate Pediatric Dentistry at (919) [phone]. Children aged 4-14, please call (919) [phone] to request a pediatric application. ° Dental services are provided in all areas of dental care including fillings, crowns and bridges, complete and partial dentures, implants, gum treatment, root canals, and extractions. Preventive care is also provided. Treatment is provided to both adults and children. °Patients are selected via a lottery and there is often a waiting list. °  °[facility] [address], ° ° (336) 763-8833 [email or web address] °  °[facility] [address], [place], [place] (336)723-1848, Ext. 123 Second and Fourth Thursday of each month, opens at 6:30 AM; Clinic ends at 9 AM.  Patients are seen on a first-come first-served basis, and a limited number are seen during each clinic.  ° °[facility] ° [address], [place], [place] (336) 723-7904   Eligibility Requirements °You must have lived in [place], [place], or [place] counties for at least the last three months. °  You  cannot be eligible for state or federal sponsored healthcare insurance, including Veterans Administration, Medicaid, or Medicare. °  You generally cannot be eligible for healthcare insurance through your employer.  °  How to apply: °Eligibility screenings are held every Tuesday and Wednesday afternoon from 1:00 pm until 4:00 pm. You do not need an appointment for the interview!  °[facility] [address], [place], [place] 336-631-2330   °[facility]  336-342-8273   °[facility]  336-703-3100   °[facility]  336-570-6415   ° °Behavioral Health Resources in the Community: °Intensive Outpatient Programs °Organization         Address  Phone  Notes  °[facility] [address], [place], [place] 336-878-6098   °[facility] [address], [place], [place] 336-832-9800   °[facility] [address], [place], [place] ° 336-882-2125   °[facility] [address],  °[place], [place] 1-800-853-5163 or 336-641-4981   °Substance Abuse Resources °Organization         Address  Phone  Notes  °[facility]  336-882-2125   °[facility]  336-784-9470   °[facility]  336-285-9073   °[facility]  336-845-3988   °Residential & Outpatient Substance Abuse Program  1-800-659-3381   °Psychological Services °Organization         Address  Phone  Notes  °[facility]  336- 832-9600   °[facility]  336- 378-7881   °[facility] [address], [place] 1-800-853-5163 or 336-641-4981   ° °Mobile Crisis Teams °Organization         Address  Phone  Notes  °[facility], Mobile Crisis Care Unit  1-877-626-1772   °Assertive °[facility] ° [address] [place], [place] 336-834-9664   °Sharon DeEsch [address] °[place] 336-554-5454   ° °Self-Help/Support  Groups °Organization         Address  Phone             Notes  °[facility] - variety of support groups  336- 373-1402 Call for more information  °Narcotics Anonymous (NA), [facility] [address], °[place]  2 meetings at this location  ° °Residential Treatment Programs °Organization         Address  Phone  Notes  °[facility] [address],    °[place]  1-866-801-8205   °[facility] ° [address], [place], [place] 704-293-8524   °[facility] [address], [place] 336-845-3988 Admissions: 8am-3pm M-F  °[facility] [address]    °[place], [place] 336-841-1104   °[facility] [address], [place], [place] 336-379-7146   °[facility] [address]  °[place], [place] 336-285-9073   °[facility] - Intensive Outpatient [address], , [place] 336-852-3033   °[facility] [address]  °[place], [place] 1-877-615-2722 or 336-784-9470   °[facility] [address] [place], [place] 336-227-7417 Accepts Medicaid  °[facility] [address]  ° [place] 1-800-659-3381 Substance Abuse/Addiction Treatment  ° °[place] Behavioral Health Resources °Organization         Address  Phone  Notes  °[facility]  (888) 581-9988   °Julie Brannon, PhD [address] [place], [place]   (336) 349-5553 or (336) 951-0000   °[facility]   [address] °[place], [place] (336) 349-4454   °[facility] [address], [place],  (336) 342-8316 Insurance/Medicaid/sponsorship through [facility]  °[facility] [address]                                    [place],  (336) 342-8316 Therapy/tele-psych/case  °[facility]   [address]  ° [place], [place] (336) 349-2233    °Dr. Arfeen  (336) 349-4544   °[facility] [place]  [employer] [facility] [place] [facility] 1) [address], [place] °2) [address], [place] °3)  [address], [place] (336) 349-3220 °(336) 342-7768 ° °(336) 342-8140   °[place] [facility] (336) 342-1394 or (336) 342-3537 (After Hours)    ° ° ° °

## 2015-01-25 ENCOUNTER — Encounter (HOSPITAL_COMMUNITY): Payer: Self-pay | Admitting: Emergency Medicine

## 2015-01-25 ENCOUNTER — Emergency Department (HOSPITAL_COMMUNITY)
Admission: EM | Admit: 2015-01-25 | Discharge: 2015-01-25 | Disposition: A | Discharge status: Home / Self Care | Payer: Self-pay | Attending: Emergency Medicine | Admitting: Emergency Medicine

## 2015-01-25 ENCOUNTER — Encounter (HOSPITAL_COMMUNITY): Payer: Self-pay

## 2015-01-25 DIAGNOSIS — K0889 Other specified disorders of teeth and supporting structures: Secondary | ICD-10-CM

## 2015-01-25 DIAGNOSIS — H00033 Abscess of eyelid right eye, unspecified eyelid: Secondary | ICD-10-CM

## 2015-01-25 DIAGNOSIS — K047 Periapical abscess without sinus: Secondary | ICD-10-CM | POA: Insufficient documentation

## 2015-01-25 DIAGNOSIS — Z792 Long term (current) use of antibiotics: Secondary | ICD-10-CM | POA: Insufficient documentation

## 2015-01-25 DIAGNOSIS — K088 Other specified disorders of teeth and supporting structures: Secondary | ICD-10-CM | POA: Insufficient documentation

## 2015-01-25 DIAGNOSIS — H00034 Abscess of left upper eyelid: Secondary | ICD-10-CM | POA: Insufficient documentation

## 2015-01-25 DIAGNOSIS — Z720 Tobacco use: Secondary | ICD-10-CM | POA: Insufficient documentation

## 2015-01-25 DIAGNOSIS — K0381 Cracked tooth: Secondary | ICD-10-CM | POA: Insufficient documentation

## 2015-01-25 MED ORDER — DOXYCYCLINE HYCLATE 100 MG PO CAPS: 100.0000 mg | ORAL_CAPSULE | Freq: Two times a day (BID) | ORAL | Status: DC

## 2015-01-25 MED ORDER — IBUPROFEN 800 MG PO TABS: 800.0000 mg | ORAL_TABLET | Freq: Three times a day (TID) | ORAL | Status: DC

## 2015-01-25 MED ORDER — ERYTHROMYCIN 5 MG/GM OP OINT
TOPICAL_OINTMENT | Freq: Three times a day (TID) | OPHTHALMIC | Status: DC
  Administered 2015-01-25: 03:00:00 via OPHTHALMIC
  Filled 2015-01-25: qty 3.5

## 2015-01-25 NOTE — ED Notes (Signed)
Pt complains of right eye pain and swelling, he was prescribed amoxicillian and percocet yesterday for a toothache, this morning he noticed his right eyelid swollen.

## 2015-01-25 NOTE — ED Provider Notes (Signed)
CSN: [id number]     Arrival date & time 01/25/15  1205 History  This chart was scribed for non-physician practitioner, Teressa Lower, NP, working with Jerelyn Scott, MD, by Ronney Lion, ED Scribe. This patient was seen in room TR08C/TR08C and the patient's care was started at 12:30 PM.    Chief Complaint  Patient presents with  . Dental Pain   The history is provided by the patient. No language interpreter was used.     HPI Comments: Paul Osborne is a 39 y.o. male who presents to the Emergency Department complaining of constant, severe right upper dental pain that began after he fractured his tooth about 4 days ago. Patient was seen here 2 days ago for the same, and he was given a course of 6 oxycodone, amoxicillin, and a dental referral for follow-up. Patient states he tried calling the dental referral but is unable to schedule an appointment for several weeks, and he complains of continuing pain. He reports moderate relief with the oxycodone he was given but has already run out. He states he has been taking the amoxicillin.   History reviewed. No pertinent past medical history. History reviewed. No pertinent past surgical history. No family history on file. History  Substance Use Topics  . Smoking status: Current Every Day Smoker  . Smokeless tobacco: Not on file  . Alcohol Use: No    Review of Systems  HENT: Positive for dental problem.   All other systems reviewed and are negative.     Allergies  Review of patient's allergies indicates no known allergies.  Home Medications   Prior to Admission medications   Medication Sig Start Date End Date Taking? Authorizing Provider  amoxicillin (AMOXIL) 500 MG capsule Take 1 capsule (500 mg total) by mouth 3 (three) times daily. 01/23/15   Hanna Patel-Mills, PA-C  cetirizine (ZYRTEC ALLERGY) 10 MG tablet Take 1 tablet (10 mg total) by mouth daily. Patient not taking: Reported on 01/25/2015 09/08/13   Eber Hong, MD  doxycycline  (VIBRAMYCIN) 100 MG capsule Take 1 capsule (100 mg total) by mouth 2 (two) times daily. 01/25/15   Elpidio Anis, PA-C  ibuprofen (ADVIL,MOTRIN) 800 MG tablet Take 1 tablet (800 mg total) by mouth 3 (three) times daily. 01/25/15   Elpidio Anis, PA-C  naproxen (NAPROSYN) 500 MG tablet Take 1 tablet (500 mg total) by mouth 2 (two) times daily with a meal. Patient not taking: Reported on 01/25/2015 09/08/13   Eber Hong, MD  oxyCODONE-acetaminophen (PERCOCET/ROXICET) 5-325 MG per tablet Take 2 tablets by mouth every 4 (four) hours as needed for severe pain. 01/23/15   Hanna Patel-Mills, PA-C   BP 131/85 mmHg  Pulse 82  Temp(Src) 97.8 F (36.6 C) (Oral)  Resp 16  Ht 5\' 11"  (1.803 m)  Wt 159 lb 9.6 oz (72.394 kg)  BMI 22.27 kg/m2  SpO2 97% Physical Exam  Constitutional: He is oriented to person, place, and time. He appears well-developed and well-nourished. No distress.  HENT:  Head: Normocephalic and atraumatic.  Mouth/Throat:    Eyes: Conjunctivae and EOM are normal.  Neck: Neck supple. No tracheal deviation present.  Cardiovascular: Normal rate.   Pulmonary/Chest: Effort normal. No respiratory distress.  Musculoskeletal: Normal range of motion.  Neurological: He is alert and oriented to person, place, and time.  Skin: Skin is warm and dry.  Psychiatric: He has a normal mood and affect. His behavior is normal.  Nursing note and vitals reviewed.   ED Course  Procedures (including critical  care time)  DIAGNOSTIC STUDIES: Oxygen Saturation is 97% on RA, normal by my interpretation.    COORDINATION OF CARE: 12:35 PM - Pt will be given alternate dental referral. Advised to continue taking antibiotics. Pt verbalized understanding and agreed to plan.  MDM   Final diagnoses:  Toothache   Discussed continued use of antibiotic and follow up with dentist  I personally performed the services described in this documentation, which was scribed in my presence. The recorded information  has been reviewed and is accurate.     Teressa Lower, NP 01/25/15 1325  Jerelyn Scott, MD 01/25/15 1355

## 2015-01-25 NOTE — ED Notes (Signed)
Pt. Stated,  I was here for dental pain and I can't get an appt. Until next week, Im out of my medicine and I need a note,

## 2015-01-25 NOTE — ED Provider Notes (Signed)
CSN: [id number]     Arrival date & time 01/25/15  0118 History   First MD Initiated Contact with Patient 01/25/15 0217     Chief Complaint  Patient presents with  . Eye Pain     (Consider location/radiation/quality/duration/timing/severity/associated sxs/prior Treatment) Patient is a 39 y.o. male presenting with eye pain. The history is provided by the patient. No language interpreter was used.  Eye Pain This is a new problem. The current episode started today. Pertinent negatives include no fever, headaches, myalgias or nausea. Associated symptoms comments: History of eye lid infection, "stye", in the past with similar symptoms of upper lid pain and swelling that started earlier today. No drainage. No visual impairment or pain with eye movement. No headaches, nausea or vomiting. Marland Kitchen    History reviewed. No pertinent past medical history. History reviewed. No pertinent past surgical history. History reviewed. No pertinent family history. History  Substance Use Topics  . Smoking status: Current Every Day Smoker  . Smokeless tobacco: Not on file  . Alcohol Use: No    Review of Systems  Constitutional: Negative for fever.  HENT: Positive for dental problem.        Currently being treated with amoxil and Percocet for dental infection.  Eyes: Positive for pain. Negative for discharge and visual disturbance.  Gastrointestinal: Negative for nausea.  Musculoskeletal: Negative for myalgias.  Neurological: Negative for headaches.      Allergies  Review of patient's allergies indicates no known allergies.  Home Medications   Prior to Admission medications   Medication Sig Start Date End Date Taking? Authorizing Provider  amoxicillin (AMOXIL) 500 MG capsule Take 1 capsule (500 mg total) by mouth 3 (three) times daily. 01/23/15  Yes Hanna Patel-Mills, PA-C  oxyCODONE-acetaminophen (PERCOCET/ROXICET) 5-325 MG per tablet Take 2 tablets by mouth every 4 (four) hours as needed for severe  pain. 01/23/15  Yes Hanna Patel-Mills, PA-C  cetirizine (ZYRTEC ALLERGY) 10 MG tablet Take 1 tablet (10 mg total) by mouth daily. Patient not taking: Reported on 01/25/2015 09/08/13   Eber Hong, MD  naproxen (NAPROSYN) 500 MG tablet Take 1 tablet (500 mg total) by mouth 2 (two) times daily with a meal. Patient not taking: Reported on 01/25/2015 09/08/13   Eber Hong, MD   BP 122/83 mmHg  Pulse 73  Temp(Src) 98.3 F (36.8 C) (Oral)  Resp 20  SpO2 100% Physical Exam  Constitutional: He is oriented to person, place, and time. He appears well-developed and well-nourished.  Eyes:  Corneas clear, no cloudiness or hyphema. Conjunctiva normal without swelling, redness or chemosis. There is focal swelling to upper lid from medial border to mid or central eye lid without palpable nodule. Swelling is more prominent at lash line. No periorbital edema, swelling, redness or tenderness.  Neck: Normal range of motion.  Pulmonary/Chest: Effort normal.  Musculoskeletal: Normal range of motion.  Neurological: He is alert and oriented to person, place, and time.  Skin: Skin is warm and dry.  Psychiatric: He has a normal mood and affect.    ED Course  Procedures (including critical care time) Labs Review Labs Reviewed - No data to display  Imaging Review No results found.   EKG Interpretation None      MDM   Final diagnoses:  None    1. Eye lid cellulitis  Will start on Doxycycline (effective agent in the past for similar diagnoses). He is already taking Percocet for dental infection. Recommended ibuprofen and warm compresses, with ophtho follow up if symptoms  do not improve.     Elpidio Anis, PA-C 01/25/15 1610  Derwood Kaplan, MD 01/25/15 [phone]

## 2015-01-25 NOTE — Discharge Instructions (Signed)

## 2015-01-25 NOTE — Discharge Instructions (Signed)
Heat Therapy Heat therapy can help ease sore, stiff, injured, and tight muscles and joints. Heat relaxes your muscles, which may help ease your pain.  RISKS AND COMPLICATIONS If you have any of the following conditions, do not use heat therapy unless your health care provider has approved:  Poor circulation.  Healing wounds or scarred skin in the area being treated.  Diabetes, heart disease, or high blood pressure.  Not being able to feel (numbness) the area being treated.  Unusual swelling of the area being treated.  Active infections.  Blood clots.  Cancer.  Inability to communicate pain. This may include young children and people who have problems with their brain function (dementia).  Pregnancy. Heat therapy should only be used on old, pre-existing, or long-lasting (chronic) injuries. Do not use heat therapy on new injuries unless directed by your health care provider. HOW TO USE HEAT THERAPY There are several different kinds of heat therapy, including:  Moist heat pack.  Warm water bath.  Hot water bottle.  Electric heating pad.  Heated gel pack.  Heated wrap.  Electric heating pad. Use the heat therapy method suggested by your health care provider. Follow your health care provider's instructions on when and how to use heat therapy. GENERAL HEAT THERAPY RECOMMENDATIONS  Do not sleep while using heat therapy. Only use heat therapy while you are awake.  Your skin may turn pink while using heat therapy. Do not use heat therapy if your skin turns red.  Do not use heat therapy if you have new pain.  High heat or long exposure to heat can cause burns. Be careful when using heat therapy to avoid burning your skin.  Do not use heat therapy on areas of your skin that are already irritated, such as with a rash or sunburn. SEEK MEDICAL CARE IF:  You have blisters, redness, swelling, or numbness.  You have new pain.  Your pain is worse. MAKE SURE  YOU:  Understand these instructions.  Will watch your condition.  Will get help right away if you are not doing well or get worse. Document Released: 10/23/2011 Document Revised: 12/15/2013 Document Reviewed: 09/23/2013 [facility] Patient Information 2015 [place], [place]. This information is not intended to replace advice given to you by your health care provider. Make sure you discuss any questions you have with your health care provider. Cellulitis Cellulitis is an infection of the skin and the tissue beneath it. The infected area is usually red and tender. Cellulitis occurs most often in the arms and lower legs.  CAUSES  Cellulitis is caused by bacteria that enter the skin through cracks or cuts in the skin. The most common types of bacteria that cause cellulitis are staphylococci and streptococci. SIGNS AND SYMPTOMS   Redness and warmth.  Swelling.  Tenderness or pain.  Fever. DIAGNOSIS  Your health care provider can usually determine what is wrong based on a physical exam. Blood tests may also be done. TREATMENT  Treatment usually involves taking an antibiotic medicine. HOME CARE INSTRUCTIONS   Take your antibiotic medicine as directed by your health care provider. Finish the antibiotic even if you start to feel better.  Keep the infected arm or leg elevated to reduce swelling.  Apply a warm cloth to the affected area up to 4 times per day to relieve pain.  Take medicines only as directed by your health care provider.  Keep all follow-up visits as directed by your health care provider. SEEK MEDICAL CARE IF:   You notice  red streaks coming from the infected area.  Your red area gets larger or turns dark in color.  Your bone or joint underneath the infected area becomes painful after the skin has healed.  Your infection returns in the same area or another area.  You notice a swollen bump in the infected area.  You develop new symptoms.  You have a fever. SEEK  IMMEDIATE MEDICAL CARE IF:   You feel very sleepy.  You develop vomiting or diarrhea.  You have a general ill feeling (malaise) with muscle aches and pains. MAKE SURE YOU:   Understand these instructions.  Will watch your condition.  Will get help right away if you are not doing well or get worse. Document Released: 05/10/2005 Document Revised: 12/15/2013 Document Reviewed: 10/16/2011 [facility] Patient Information 2015 [place], [place]. This information is not intended to replace advice given to you by your health care provider. Make sure you discuss any questions you have with your health care provider.

## 2015-05-18 ENCOUNTER — Emergency Department (HOSPITAL_COMMUNITY)
Admission: EM | Admit: 2015-05-18 | Discharge: 2015-05-18 | Discharge status: Absent without leave | Payer: Self-pay | Source: Home / Self Care

## 2015-05-18 ENCOUNTER — Encounter (HOSPITAL_COMMUNITY): Payer: Self-pay | Admitting: *Deleted

## 2015-05-18 ENCOUNTER — Emergency Department (HOSPITAL_COMMUNITY)
Admission: EM | Admit: 2015-05-18 | Discharge: 2015-05-18 | Disposition: A | Discharge status: Home / Self Care | Payer: Self-pay | Attending: Emergency Medicine | Admitting: Emergency Medicine

## 2015-05-18 DIAGNOSIS — Z792 Long term (current) use of antibiotics: Secondary | ICD-10-CM | POA: Insufficient documentation

## 2015-05-18 DIAGNOSIS — Z79899 Other long term (current) drug therapy: Secondary | ICD-10-CM | POA: Insufficient documentation

## 2015-05-18 DIAGNOSIS — Z720 Tobacco use: Secondary | ICD-10-CM | POA: Insufficient documentation

## 2015-05-18 DIAGNOSIS — N4889 Other specified disorders of penis: Secondary | ICD-10-CM | POA: Insufficient documentation

## 2015-05-18 NOTE — ED Provider Notes (Signed)
CSN: [id number]     Arrival date & time 05/18/15  1438 History   By signing my name below, I, Murriel Hopper, attest that this documentation has been prepared under the direction and in the presence of Burna Forts, PA-C Electronically Signed: Murriel Hopper, ED Scribe. 05/18/2015. 4:43 PM.  Chief Complaint  Patient presents with  . Abscess     The history is provided by the patient. No language interpreter was used.   HPI Comments: Paul Osborne is a 39 y.o. male who presents to the Emergency Department complaining of a worsening abscess to the left side of his penis that has been present for a few days. Pt states he gets them occasionally from shaving, and notices that the lymph nodes on both sides of his thighs were swollen and got scared, so he came to ED. Pt denies any symptoms similar to this before. Pt states he is sexually active, and uses condoms. Pt denies penile discharge, dysuria, abdominal pain, fever.   Wore different underwear   History reviewed. No pertinent past medical history. History reviewed. No pertinent past surgical history. History reviewed. No pertinent family history. Social History  Substance Use Topics  . Smoking status: Current Every Day Smoker  . Smokeless tobacco: None  . Alcohol Use: No    Review of Systems  A complete 10 system review of systems was obtained and all systems are negative except as noted in the HPI and PMH.    Allergies  Review of patient's allergies indicates no known allergies.  Home Medications   Prior to Admission medications   Medication Sig Start Date End Date Taking? Authorizing Provider  amoxicillin (AMOXIL) 500 MG capsule Take 1 capsule (500 mg total) by mouth 3 (three) times daily. 01/23/15   Hanna Patel-Mills, PA-C  cetirizine (ZYRTEC ALLERGY) 10 MG tablet Take 1 tablet (10 mg total) by mouth daily. Patient not taking: Reported on 01/25/2015 09/08/13   Eber Hong, MD  doxycycline (VIBRAMYCIN) 100 MG capsule Take 1  capsule (100 mg total) by mouth 2 (two) times daily. 01/25/15   Elpidio Anis, PA-C  ibuprofen (ADVIL,MOTRIN) 800 MG tablet Take 1 tablet (800 mg total) by mouth 3 (three) times daily. 01/25/15   Elpidio Anis, PA-C  naproxen (NAPROSYN) 500 MG tablet Take 1 tablet (500 mg total) by mouth 2 (two) times daily with a meal. Patient not taking: Reported on 01/25/2015 09/08/13   Eber Hong, MD  oxyCODONE-acetaminophen (PERCOCET/ROXICET) 5-325 MG per tablet Take 2 tablets by mouth every 4 (four) hours as needed for severe pain. 01/23/15   Hanna Patel-Mills, PA-C   BP 149/97 mmHg  Pulse 79  Temp(Src) 98.4 F (36.9 C) (Oral)  Resp 18  SpO2 98%  \ Physical Exam  Constitutional: He is oriented to person, place, and time. He appears well-developed and well-nourished.  HENT:  Head: Normocephalic and atraumatic.  Eyes: Conjunctivae are normal. Pupils are equal, round, and reactive to light. Right eye exhibits no discharge. Left eye exhibits no discharge. No scleral icterus.  Neck: Normal range of motion. No JVD present. No tracheal deviation present.  Cardiovascular: Normal rate.   Pulmonary/Chest: Effort normal. No stridor.  Abdominal: He exhibits no distension.  Genitourinary: Penis normal.  Penis normal no signs of infection, swelling. One small white head on pubic mound, no surrounding signs of infection   Neurological: He is alert and oriented to person, place, and time. Coordination normal.  Skin: Skin is warm and dry.  Psychiatric: He has a normal mood  and affect. His behavior is normal. Judgment and thought content normal.  Nursing note and vitals reviewed.   ED Course  Procedures (including critical care time)  DIAGNOSTIC STUDIES: Oxygen Saturation is 98% on room air, normal by my interpretation.    COORDINATION OF CARE: 3:40 PM Discussed treatment plan with pt at bedside and pt agreed to plan.   Labs Review Labs Reviewed  RPR  HIV ANTIBODY (ROUTINE TESTING)  GC/CHLAMYDIA PROBE  AMP ([place]) NOT AT [facility]    Imaging Review No results found. I have personally reviewed and evaluated these images and lab results as part of my medical decision-making.   EKG Interpretation None      MDM   Final diagnoses:  Penile irritation   Labs: Gonorrhea, chlamydia, STD, RPR  Imaging:    Consults:   Therapeutics:  Discharge Meds:   Assessment/Plan:  Patient presents with complaints of penile irritation after shaving. No signs of infection swelling, or any other significant pathology at this time. Uncertain etiology of penile pain and irritation, patient recalls wearing underwear before washing them, this is cause similar presentation previously that was resolved without intervention. Patient instructed to contact [facility] and schedule follow-up evaluation, return immediately if new or worsening signs or symptoms present. Patient has no penile discharge, but is sexually active, STD testing performed results will be called in to the patient. Strict return precautions, verbalized understanding and agreement for today's plan.      I personally performed the services described in this documentation, which was scribed in my presence. The recorded information has been reviewed and is accurate.    Eyvonne Mechanic, PA-C 05/18/15 1643  Mirian Mo, MD 05/20/15 [phone]

## 2015-05-18 NOTE — ED Notes (Addendum)
Patient reports "shaving bumps" to scrotum from shaving. Patient also reports that he has swollen lymph nodes to bilateral lower abdomen that are painful.

## 2015-05-18 NOTE — ED Notes (Signed)
Patient able to ambulate independently  

## 2015-05-18 NOTE — Discharge Instructions (Signed)
Pain of Unknown Etiology (Pain Without a Known Cause) You have come to your caregiver because of pain. Pain can occur in any part of the body. Often there is not a definite cause. If your laboratory (blood or urine) work was normal and X-rays or other studies were normal, your caregiver may treat you without knowing the cause of the pain. An example of this is the headache. Most headaches are diagnosed by taking a history. This means your caregiver asks you questions about your headaches. Your caregiver determines a treatment based on your answers. Usually testing done for headaches is normal. Often testing is not done unless there is no response to medications. Regardless of where your pain is located today, you can be given medications to make you comfortable. If no physical cause of pain can be found, most cases of pain will gradually leave as suddenly as they came.  If you have a painful condition and no reason can be found for the pain, it is important that you follow up with your caregiver. If the pain becomes worse or does not go away, it may be necessary to repeat tests and look further for a possible cause.  Only take over-the-counter or prescription medicines for pain, discomfort, or fever as directed by your caregiver.  For the protection of your privacy, test results cannot be given over the phone. Make sure you receive the results of your test. Ask how these results are to be obtained if you have not been informed. It is your responsibility to obtain your test results.  You may continue all activities unless the activities cause more pain. When the pain lessens, it is important to gradually resume normal activities. Resume activities by beginning slowly and gradually increasing the intensity and duration of the activities or exercise. During periods of severe pain, bed rest may be helpful. Lie or sit in any position that is comfortable.  Ice used for acute (sudden) conditions may be effective.  Use a large plastic bag filled with ice and wrapped in a towel. This may provide pain relief.  See your caregiver for continued problems. Your caregiver can help or refer you for exercises or physical therapy if necessary. If you were given medications for your condition, do not drive, operate machinery or power tools, or sign legal documents for 24 hours. Do not drink alcohol, take sleeping pills, or take other medications that may interfere with treatment. See your caregiver immediately if you have pain that is becoming worse and not relieved by medications. Document Released: 04/25/2001 Document Revised: 05/21/2013 Document Reviewed: 07/31/2005 [facility] Patient Information 2015 [place], [place]. This information is not intended to replace advice given to you by your health care provider. Make sure you discuss any questions you have with your health care provider.  Please make follow-up appointment with [facility] for further evaluation and management. Please avoid shaving, or any activity that causes irritation of the penis. Please return immediately if signs of infection present.

## 2015-05-19 ENCOUNTER — Encounter: Payer: Self-pay | Admitting: Physician Assistant

## 2015-05-19 ENCOUNTER — Ambulatory Visit: Payer: Self-pay | Attending: Family Medicine | Admitting: Physician Assistant

## 2015-05-19 VITALS — BP 132/91 | HR 78 | Temp 98.0°F | Resp 18 | Ht 69.5 in | Wt 159.6 lb

## 2015-05-19 DIAGNOSIS — N4889 Other specified disorders of penis: Secondary | ICD-10-CM

## 2015-05-19 LAB — CBC WITH DIFFERENTIAL/PLATELET
Basophils Absolute: 0.1 10*3/uL (ref 0.0–0.1)
Basophils Relative: 1 % (ref 0–1)
EOS PCT: 3 % (ref 0–5)
Eosinophils Absolute: 0.2 10*3/uL (ref 0.0–0.7)
HEMATOCRIT: 48.4 % (ref 39.0–52.0)
Hemoglobin: 17.1 g/dL — ABNORMAL HIGH (ref 13.0–17.0)
LYMPHS PCT: 31 % (ref 12–46)
Lymphs Abs: 2 10*3/uL (ref 0.7–4.0)
MCH: 31.2 pg (ref 26.0–34.0)
MCHC: 35.3 g/dL (ref 30.0–36.0)
MCV: 88.3 fL (ref 78.0–100.0)
MONO ABS: 0.9 10*3/uL (ref 0.1–1.0)
MPV: 9.2 fL (ref 8.6–12.4)
Monocytes Relative: 14 % — ABNORMAL HIGH (ref 3–12)
Neutro Abs: 3.2 10*3/uL (ref 1.7–7.7)
Neutrophils Relative %: 51 % (ref 43–77)
Platelets: 272 10*3/uL (ref 150–400)
RBC: 5.48 MIL/uL (ref 4.22–5.81)
RDW: 13 % (ref 11.5–15.5)
WBC: 6.3 10*3/uL (ref 4.0–10.5)

## 2015-05-19 LAB — RPR: RPR Ser Ql: NONREACTIVE

## 2015-05-19 LAB — GC/CHLAMYDIA PROBE AMP (~~LOC~~) NOT AT ARMC
Chlamydia: NEGATIVE
Neisseria Gonorrhea: NEGATIVE

## 2015-05-19 LAB — HIV ANTIBODY (ROUTINE TESTING W REFLEX): HIV Screen 4th Generation wRfx: NONREACTIVE

## 2015-05-19 NOTE — Progress Notes (Signed)
Pt here for ED F/U for penile irritation. Pt reports his lymph nodes are still a little swollen on the left and right side of lower abdomen. Patient reports it is sensitive to the touch. Patient reports yesterday it was really hurting but today it isn't. Patient reports he is not taking any medications.

## 2015-05-19 NOTE — Progress Notes (Signed)
Paul Osborne  ZOX:[id number]  [id number]  DOB - Oct 31, 1975  Chief Complaint  Patient presents with  . Hospitalization Follow-up       Subjective:   Paul Osborne is a 39 y.o. male here today for establishment of care. He was in the emergency department on 05/18/2015 with what he described as a penile irritation. He states that he has a growth on the left side of the shaft of the penis for 2 days. It is tender to touch. He did shave that area recently and thought initially that it may have been a hair bump. Later he developed some swelling and discomfort in bilateral inguinal lymph nodes prompting his visit to the emergency department. He has no penile discharge. He has no dysuria, no hematuria.  In the emergency department and HIV test was negative. RPR was negative. His DNA probe is pending. He feels a little better today. He has less swelling and less discomfort. He is not willing to let me see the problem.   ROS: GEN: denies fever or chills, denies change in weight Skin: denies lesions or rashes ABD: denies abd pain, N or V GU: no dysuria, no hematuria, no discharge  Problem  Penile Cyst    ALLERGIES: No Known Allergies  PAST MEDICAL HISTORY: History reviewed. No pertinent past medical history.  PAST SURGICAL HISTORY: History reviewed. No pertinent past surgical history.  MEDICATIONS AT HOME: Prior to Admission medications   Medication Sig Start Date End Date Taking? Authorizing Provider  amoxicillin (AMOXIL) 500 MG capsule Take 1 capsule (500 mg total) by mouth 3 (three) times daily. Patient not taking: Reported on 05/19/2015 01/23/15   Catha Gosselin, PA-C  cetirizine (ZYRTEC ALLERGY) 10 MG tablet Take 1 tablet (10 mg total) by mouth daily. Patient not taking: Reported on 01/25/2015 09/08/13   Eber Hong, MD  doxycycline (VIBRAMYCIN) 100 MG capsule Take 1 capsule (100 mg total) by mouth 2 (two) times daily. Patient not taking: Reported on 05/19/2015  01/25/15   Elpidio Anis, PA-C  ibuprofen (ADVIL,MOTRIN) 800 MG tablet Take 1 tablet (800 mg total) by mouth 3 (three) times daily. Patient not taking: Reported on 05/19/2015 01/25/15   Elpidio Anis, PA-C  naproxen (NAPROSYN) 500 MG tablet Take 1 tablet (500 mg total) by mouth 2 (two) times daily with a meal. Patient not taking: Reported on 01/25/2015 09/08/13   Eber Hong, MD  oxyCODONE-acetaminophen (PERCOCET/ROXICET) 5-325 MG per tablet Take 2 tablets by mouth every 4 (four) hours as needed for severe pain. Patient not taking: Reported on 05/19/2015 01/23/15   Catha Gosselin, PA-C     Objective:   Filed Vitals:   05/19/15 1441  BP: 132/91  Pulse: 78  Temp: 98 F (36.7 C)  TempSrc: Oral  Resp: 18  Height: 5' 9.5" (1.765 m)  Weight: 159 lb 9.6 oz (72.394 kg)  SpO2: 96%    Exam General appearance : Awake, alert, not in any distress. Speech Clear. Not toxic looking Abdomen: Bowel sounds present, Non tender and not distended with no gaurding, rigidity or rebound. Extremities: B/L Lower Ext shows no edema, both legs are warm to touch Neurology: Awake alert, and oriented X 3, CN II-XII intact, Non focal Skin:No Rash GU:pt declined   Assessment & Plan  1. Penile cyst/irritation-afebrile. Not warm to touch per ED note or patient. Will check a CBC. Will follow up on DNA probe. No antibiotics prescribed this visit. Will have him return in 2 weeks if not better to see male Physician.  Return in about 2 weeks (around 06/02/2015) for routine health maintenance and follow up.  The patient was given clear instructions to go to ER or return to medical center if symptoms don't improve, worsen or new problems develop. The patient verbalized understanding. The patient was told to call to get lab results if they haven't heard anything in the next week.   This note has been created with Education officer, environmental. Any transcriptional errors are  unintentional.    Scot Jun, PA-C [facility] [place], [place] [phone]   05/19/2015, 3:06 PM

## 2015-06-03 ENCOUNTER — Ambulatory Visit: Payer: Self-pay | Admitting: Internal Medicine

## 2016-11-07 ENCOUNTER — Emergency Department (HOSPITAL_COMMUNITY)
Admission: EM | Admit: 2016-11-07 | Discharge: 2016-11-08 | Disposition: A | Discharge status: Home / Self Care | Payer: Self-pay | Attending: Emergency Medicine | Admitting: Emergency Medicine

## 2016-11-07 ENCOUNTER — Encounter (HOSPITAL_COMMUNITY): Payer: Self-pay

## 2016-11-07 ENCOUNTER — Emergency Department (HOSPITAL_COMMUNITY): Payer: Self-pay

## 2016-11-07 DIAGNOSIS — F17200 Nicotine dependence, unspecified, uncomplicated: Secondary | ICD-10-CM | POA: Insufficient documentation

## 2016-11-07 DIAGNOSIS — R0789 Other chest pain: Secondary | ICD-10-CM | POA: Insufficient documentation

## 2016-11-07 DIAGNOSIS — I10 Essential (primary) hypertension: Secondary | ICD-10-CM | POA: Insufficient documentation

## 2016-11-07 DIAGNOSIS — Z79899 Other long term (current) drug therapy: Secondary | ICD-10-CM | POA: Insufficient documentation

## 2016-11-07 HISTORY — DX: Essential (primary) hypertension: I10

## 2016-11-07 LAB — BASIC METABOLIC PANEL
Anion gap: 10 (ref 5–15)
BUN: 8 mg/dL (ref 6–20)
CHLORIDE: 103 mmol/L (ref 101–111)
CO2: 24 mmol/L (ref 22–32)
Calcium: 9.2 mg/dL (ref 8.9–10.3)
Creatinine, Ser: 1 mg/dL (ref 0.61–1.24)
GFR calc Af Amer: >60 mL/min (ref >60)
Glucose, Bld: 104 mg/dL — ABNORMAL HIGH (ref 65–99)
POTASSIUM: 3.6 mmol/L (ref 3.5–5.1)
Sodium: 137 mmol/L (ref 135–145)

## 2016-11-07 LAB — I-STAT TROPONIN, ED
Troponin i, poc: 0 ng/mL (ref 0.00–0.08)
Troponin i, poc: 0 ng/mL (ref 0.00–0.08)

## 2016-11-07 LAB — CBC
HEMATOCRIT: 47.2 % (ref 39.0–52.0)
Hemoglobin: 15.5 g/dL (ref 13.0–17.0)
MCH: 29.6 pg (ref 26.0–34.0)
MCHC: 32.8 g/dL (ref 30.0–36.0)
MCV: 90.1 fL (ref 78.0–100.0)
Platelets: 257 10*3/uL (ref 150–400)
RBC: 5.24 MIL/uL (ref 4.22–5.81)
RDW: 12.6 % (ref 11.5–15.5)
WBC: 8.3 10*3/uL (ref 4.0–10.5)

## 2016-11-07 LAB — D-DIMER, QUANTITATIVE (NOT AT ARMC)

## 2016-11-07 NOTE — ED Notes (Signed)
Family at bedside. 

## 2016-11-07 NOTE — ED Triage Notes (Signed)
Pt presents to the ed with complaints of chest pain at the top of his chest that radiates to his back, it started yesterday and got worse today. Denies any other symptoms.

## 2016-11-07 NOTE — ED Provider Notes (Signed)
[facility]-EMERGENCY DEPT Provider Note   CSN: [id number] Arrival date & time: 11/07/16  1802     History   Chief Complaint Chief Complaint  Patient presents with  . Chest Pain    HPI Paul Osborne is a 41 y.o. male.  Patient presents with intermittent central and right-sided chest pain since last night about midnight. Pain comes and goes lasting several hours at a time. Intermittently radiates to his upper back. Denies shortness of breath, cough, fever, chills, nausea or vomiting. Pain lasts several hours at a time it is not worse with exertion. It is worse with right arm movement. 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 Denies any cardiac history. Never had a stress test. Admits to cigarette and marijuana use but no cocaine use. No diagnosis of hypertension or diabetes. Pain is worse with palpation and movement. Denies any low back pain or abdominal pain.   The history is provided by the patient.  Chest Pain   Pertinent negatives include no abdominal pain, no back pain, no cough, no dizziness, no fever, no headaches, no nausea, no shortness of breath, no vomiting and no weakness.    Past Medical History:  Diagnosis Date  . Hypertension     Patient Active Problem List   Diagnosis Date Noted  . Penile cyst 05/19/2015    History reviewed. No pertinent surgical history.     Home Medications    Prior to Admission medications   Medication Sig Start Date End Date Taking? Authorizing Provider  amoxicillin (AMOXIL) 500 MG capsule Take 1 capsule (500 mg total) by mouth 3 (three) times daily. Patient not taking: Reported on 05/19/2015 01/23/15   Catha Gosselin, PA-C  cetirizine (ZYRTEC ALLERGY) 10 MG tablet Take 1 tablet (10 mg total) by mouth daily. Patient not taking: Reported on 01/25/2015 09/08/13   Eber Hong, MD  doxycycline (VIBRAMYCIN) 100 MG capsule Take 1 capsule (100 mg total) by mouth 2 (two) times daily. Patient not taking:  Reported on 05/19/2015 01/25/15   Elpidio Anis, PA-C  ibuprofen (ADVIL,MOTRIN) 800 MG tablet Take 1 tablet (800 mg total) by mouth 3 (three) times daily. Patient not taking: Reported on 05/19/2015 01/25/15   Elpidio Anis, PA-C  naproxen (NAPROSYN) 500 MG tablet Take 1 tablet (500 mg total) by mouth 2 (two) times daily with a meal. Patient not taking: Reported on 01/25/2015 09/08/13   Eber Hong, MD  oxyCODONE-acetaminophen (PERCOCET/ROXICET) 5-325 MG per tablet Take 2 tablets by mouth every 4 (four) hours as needed for severe pain. Patient not taking: Reported on 05/19/2015 01/23/15   Catha Gosselin, PA-C    Family History History reviewed. No pertinent family history.  Social History Social History  Substance Use Topics  . Smoking status: Current Every Day Smoker    Packs/day: 0.50  . Smokeless tobacco: Never Used  . Alcohol use Yes     Comment: sociable     Allergies   Patient has no known allergies.   Review of Systems Review of Systems  Constitutional: Negative for activity change, appetite change and fever.  HENT: Negative for congestion.   Respiratory: Positive for chest tightness. Negative for cough and shortness of breath.   Cardiovascular: Positive for chest pain.  Gastrointestinal: Negative for abdominal pain, nausea and vomiting.  Genitourinary: Negative for dysuria and hematuria.  Musculoskeletal: Negative for arthralgias, back pain and myalgias.  Neurological: Negative for dizziness, weakness, light-headedness and headaches.  A complete 10 system review of systems was  obtained and all systems are negative except as noted in the HPI and PMH.     Physical Exam Updated Vital Signs BP (!) 151/106   Pulse 81   Temp 98.6 F (37 C)   Resp 10   Ht 5\' 9"  (1.753 m)   Wt 150 lb (68 kg)   SpO2 100%   BMI 22.15 kg/m   Physical Exam  Constitutional: He is oriented to person, place, and time. He appears well-developed and well-nourished. No distress.  HENT:    Head: Normocephalic and atraumatic.  Mouth/Throat: Oropharynx is clear and moist. No oropharyngeal exudate.  Eyes: Conjunctivae and EOM are normal. Pupils are equal, round, and reactive to light.  Neck: Normal range of motion. Neck supple.  No meningismus.  Cardiovascular: Normal rate, regular rhythm, normal heart sounds and intact distal pulses.   No murmur heard. Pulmonary/Chest: Effort normal and breath sounds normal. No respiratory distress. He exhibits tenderness.  R chest wall tenderness. Worse with palpation and R arm movement  Abdominal: Soft. There is no tenderness. There is no rebound and no guarding.  Genitourinary:  Genitourinary Comments: Induration to 12 oclock position of anus. No fluctuance  area of induration to 6 oclock position without fluctuance. No erythema  Musculoskeletal: Normal range of motion. He exhibits no edema or tenderness.  Neurological: He is alert and oriented to person, place, and time. No cranial nerve deficit. He exhibits normal muscle tone. Coordination normal.  No ataxia on finger to nose bilaterally. No pronator drift. 5/5 strength throughout. CN 2-12 intact.Equal grip strength. Sensation intact.   Skin: Skin is warm.  Psychiatric: He has a normal mood and affect. His behavior is normal.  Nursing note and vitals reviewed.    ED Treatments / Results  Labs (all labs ordered are listed, but only abnormal results are displayed) Labs Reviewed  BASIC METABOLIC PANEL - Abnormal; Notable for the following:       Result Value   Glucose, Bld 104 (*)    All other components within normal limits  CBC  D-DIMER, QUANTITATIVE (NOT AT [facility])  I-STAT TROPOININ, ED  I-STAT TROPOININ, ED    EKG  EKG Interpretation  Date/Time:  Tuesday November 07 2016 18:14:16 EDT Ventricular Rate:  83 PR Interval:  144 QRS Duration: 84 QT Interval:  338 QTC Calculation: 397 R Axis:   68 Text Interpretation:  Normal sinus rhythm Normal ECG No significant change was  found Confirmed by Manus Gunning  MD, Elyssa Pendelton [phone]) on 11/07/2016 11:01:37 PM       Radiology Dg Chest 2 View  Result Date: 11/07/2016 CLINICAL DATA:  Upper right side chest pain for 2 days EXAM: CHEST  2 VIEW COMPARISON:  11/20/2008 FINDINGS: Cardiomediastinal silhouette is unremarkable. No infiltrate or pulmonary edema. 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 IMPRESSION: No infiltrate or pulmonary edema. 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 Electronically Signed   By: Natasha Mead M.D.   On: 11/07/2016 18:55    Procedures Procedures (including critical care time)  Medications Ordered in ED Medications - No data to display   Initial Impression / Assessment and Plan / ED Course  I have reviewed the triage vital signs and the nursing notes.  Pertinent labs & imaging results that were available during my care of the patient were reviewed by me and considered in my medical decision making (see chart for details).     Patient with  intermittent right-sided chest pain for the past day. And is worse with palpation and worse with movement.  EKG is normal sinus rhythm. No evidence of ischemia. Troponin is negative. Low suspicion for ACS. D-dimer negative as well.  Pain seems consistent with musculokeletal chest pain. No evidence of ACS or PE. She also concerned about areas of induration he has along his anus. These appear to be chronic and there is no evidence of fluctuance or erythema. Will be given antibiotics for possible early infection. He declines any attempt at aspiration or drainage at this time. Return precautions discussed.    Final Clinical Impressions(s) / ED Diagnoses   Final diagnoses:  Chest wall pain    New Prescriptions New Prescriptions   No medications on file     Glynn OctaveStephen Zack Crager, MD 11/08/16 0630

## 2016-11-08 MED ORDER — DOXYCYCLINE HYCLATE 100 MG PO CAPS: 100.0000 mg | ORAL_CAPSULE | Freq: Two times a day (BID) | ORAL | 0 refills | Status: DC

## 2016-11-08 MED ORDER — NAPROXEN 500 MG PO TABS: 500.0000 mg | ORAL_TABLET | Freq: Two times a day (BID) | ORAL | 0 refills | Status: DC

## 2016-11-08 NOTE — Discharge Instructions (Signed)
There is no evidence of heart attack or blood clot in the lung. Follow up with your doctor. Return to the ED if you develop new or worsening symptoms.  °

## 2016-11-08 NOTE — ED Notes (Signed)
Pt stable, ambulatory, states understanding of discharge instructions 

## 2017-02-15 ENCOUNTER — Emergency Department (HOSPITAL_COMMUNITY)
Admission: EM | Admit: 2017-02-15 | Discharge: 2017-02-15 | Disposition: A | Discharge status: Home / Self Care | Payer: Self-pay | Attending: Emergency Medicine | Admitting: Emergency Medicine

## 2017-02-15 ENCOUNTER — Encounter (HOSPITAL_COMMUNITY): Payer: Self-pay

## 2017-02-15 DIAGNOSIS — Z79899 Other long term (current) drug therapy: Secondary | ICD-10-CM | POA: Insufficient documentation

## 2017-02-15 DIAGNOSIS — L0231 Cutaneous abscess of buttock: Secondary | ICD-10-CM | POA: Insufficient documentation

## 2017-02-15 DIAGNOSIS — I10 Essential (primary) hypertension: Secondary | ICD-10-CM | POA: Insufficient documentation

## 2017-02-15 DIAGNOSIS — F17200 Nicotine dependence, unspecified, uncomplicated: Secondary | ICD-10-CM | POA: Insufficient documentation

## 2017-02-15 MED ORDER — LIDOCAINE-EPINEPHRINE (PF) 2 %-1:200000 IJ SOLN
20.0000 mL | Freq: Once | INTRAMUSCULAR | Status: AC
  Administered 2017-02-15: 20 mL
  Filled 2017-02-15: qty 20

## 2017-02-15 MED ORDER — HYDROCODONE-ACETAMINOPHEN 5-325 MG PO TABS
1.0000 | ORAL_TABLET | Freq: Once | ORAL | Status: AC
  Administered 2017-02-15: 1 via ORAL
  Filled 2017-02-15: qty 1

## 2017-02-15 MED ORDER — CEPHALEXIN 500 MG PO CAPS
500.0000 mg | ORAL_CAPSULE | Freq: Four times a day (QID) | ORAL | 0 refills | Status: AC
Start: 2017-02-15 — End: 2017-02-20

## 2017-02-15 NOTE — ED Triage Notes (Signed)
Patient presents with abscess to right butt cheek, starting approx 2-3 days ago. Patient reports it started the size of a pea, but "then I squeezed it, and nothing came out, but it got bigger." Patient reports history of abscesses. [facility] but was "told it was a hernia." Patient states "I just want this drained." Patient denies fever/vomiting/diarrhea.

## 2017-02-15 NOTE — ED Provider Notes (Signed)
[facility]-EMERGENCY DEPT Provider Note   CSN: [id number] Arrival date & time: 02/15/17  1313  By signing my name below, I, Thelma Barge, attest that this documentation has been prepared under the direction and in the presence of Leilynn Pilat PA-C. Electronically Signed: Thelma Barge, Scribe. 02/15/17. 3:00 PM. History   Chief Complaint Chief Complaint  Patient presents with  . Abscess    right buttocks    The history is provided by the patient. No language interpreter was used.   HPI Comments: Paul Osborne is a 41 y.o. male who presents to the Emergency Department complaining of constant, gradually worsening Painful bump to his right buttock that began 2-3 days ago. He has tried squeezing it but it has worsened. Pt has had an abscess before. He denies fever, chills, nausea, and vomiting. Pt has NKDA and no pertinent medical histories.   Past Medical History:  Diagnosis Date  . Hypertension     Patient Active Problem List   Diagnosis Date Noted  . Penile cyst 05/19/2015    History reviewed. No pertinent surgical history.     Home Medications    Prior to Admission medications   Medication Sig Start Date End Date Taking? Authorizing Provider  amoxicillin (AMOXIL) 500 MG capsule Take 1 capsule (500 mg total) by mouth 3 (three) times daily. Patient not taking: Reported on 05/19/2015 01/23/15   Patel-Mills, Lorelle Formosa, PA-C  cephALEXin (KEFLEX) 500 MG capsule Take 1 capsule (500 mg total) by mouth 4 (four) times daily. 02/15/17 02/20/17  Inanna Telford, PA-C  cetirizine (ZYRTEC ALLERGY) 10 MG tablet Take 1 tablet (10 mg total) by mouth daily. Patient not taking: Reported on 01/25/2015 09/08/13   Eber Hong, MD  doxycycline (VIBRAMYCIN) 100 MG capsule Take 1 capsule (100 mg total) by mouth 2 (two) times daily. 11/08/16   Rancour, Jeannett Senior, MD  ibuprofen (ADVIL,MOTRIN) 800 MG tablet Take 1 tablet (800 mg total) by mouth 3 (three) times daily. Patient not taking: Reported on 05/19/2015 01/25/15    Elpidio Anis, PA-C  naproxen (NAPROSYN) 500 MG tablet Take 1 tablet (500 mg total) by mouth 2 (two) times daily. 11/08/16   Rancour, Jeannett Senior, MD  oxyCODONE-acetaminophen (PERCOCET/ROXICET) 5-325 MG per tablet Take 2 tablets by mouth every 4 (four) hours as needed for severe pain. Patient not taking: Reported on 05/19/2015 01/23/15   Catha Gosselin, PA-C    Family History History reviewed. No pertinent family history.  Social History Social History  Substance Use Topics  . Smoking status: Current Every Day Smoker    Packs/day: 0.50  . Smokeless tobacco: Never Used  . Alcohol use Yes     Comment: sociable     Allergies   Patient has no known allergies.   Review of Systems Review of Systems  Constitutional: Negative for chills and fever.  Gastrointestinal: Negative for nausea and vomiting.  Skin: Positive for wound.     Physical Exam Updated Vital Signs BP (!) 131/97 (BP Location: Left Arm)   Pulse 71   Temp 98.2 F (36.8 C) (Oral)   Resp 16   Ht 5\' 9"  (1.753 m)   Wt 68.2 kg (150 lb 6.4 oz)   SpO2 100%   BMI 22.21 kg/m   Physical Exam  Constitutional: He appears well-developed and well-nourished. No distress.  HENT:  Head: Normocephalic and atraumatic.  Eyes: Conjunctivae and EOM are normal. No scleral icterus.  Neck: Normal range of motion.  Pulmonary/Chest: Effort normal. No respiratory distress.  Neurological: He is alert.  Skin:  No rash noted. He is not diaphoretic.  2 cm area of induration on the right inner buttocks TTP of the area No drainage present No surrounding temperature or color change suggesting cellulitis Chaperone present throughout exam  Psychiatric: He has a normal mood and affect.  Nursing note and vitals reviewed.    ED Treatments / Results  DIAGNOSTIC STUDIES: Oxygen Saturation is 100% on RA, normal by my interpretation.    COORDINATION OF CARE: 2:59 PM Discussed treatment plan with pt at bedside and pt agreed to  plan.  Labs (all labs ordered are listed, but only abnormal results are displayed) Labs Reviewed - No data to display  EKG  EKG Interpretation None       Radiology No results found.  Procedures .Marland KitchenIncision and Drainage Date/Time: 02/15/2017 4:02 PM Performed by: Dietrich Pates Authorized by: Dietrich Pates   Consent:    Consent obtained:  Verbal   Consent given by:  Patient   Risks discussed:  Bleeding, incomplete drainage and pain   Alternatives discussed:  No treatment Location:    Type:  Abscess   Size:  2cm   Location:  Anogenital   Anogenital location: anorectal (on R buttock) Pre-procedure details:    Skin preparation:  Betadine Anesthesia (see MAR for exact dosages):    Anesthesia method:  Local infiltration   Local anesthetic:  Lidocaine 2% WITH epi Procedure details:    Incision types:  Stab incision   Scalpel blade:  11   Wound management:  Probed and deloculated and irrigated with saline   Drainage:  Bloody and purulent   Drainage amount:  Moderate   Wound treatment:  Wound left open   Packing materials:  None Post-procedure details:    Patient tolerance of procedure:  Tolerated well, no immediate complications   (including critical care time)  Medications Ordered in ED Medications  HYDROcodone-acetaminophen (NORCO/VICODIN) 5-325 MG per tablet 1 tablet (1 tablet Oral Given 02/15/17 1522)  lidocaine-EPINEPHrine (XYLOCAINE W/EPI) 2 %-1:200000 (PF) injection 20 mL (20 mLs Infiltration Given 02/15/17 1522)     Initial Impression / Assessment and Plan / ED Course  I have reviewed the triage vital signs and the nursing notes.  Pertinent labs & imaging results that were available during my care of the patient were reviewed by me and considered in my medical decision making (see chart for details).     Patient presents to ED for abscess on right buttock. He states that he has a previous history of similar abscesses that of resolved on their own or drainage. He  reports the current one became painful to 3 days ago. He denies any fever, chills. On physical exam there is a 2 cm area of induration and fluctuance on the right buttock. There is no signs of surrounding erythema or cellulitis. He is afebrile with no history of fever. Area was incised and drained of bloody and purulent drainage and irrigated with saline. No packing materials warranted due to size. Will discharge patient with Keflex due to his recurrent abscesses. He denies any previous history of MRSA infection. Patient appears stable for discharge at this time. Strict return precautions given.  Final Clinical Impressions(s) / ED Diagnoses   Final diagnoses:  Abscess of buttock, right    New Prescriptions New Prescriptions   CEPHALEXIN (KEFLEX) 500 MG CAPSULE    Take 1 capsule (500 mg total) by mouth 4 (four) times daily.   I personally performed the services described in this documentation, which was scribed in my  presence. The recorded information has been reviewed and is accurate.     Dietrich PatesKhatri, Ashaun Gaughan, PA-C 02/15/17 1608    Doug SouJacubowitz, Sam, MD 02/15/17 [phone]

## 2017-02-15 NOTE — Discharge Instructions (Signed)
Please read attached information regarding your condition. Take Keflex 4 times daily for 5 days. Follow-up with PCP for further evaluation if needed. Return to ED for worsening pain, additional abscesses, fever, increased drainage or bleeding from site or other signs of infection.

## 2017-04-07 ENCOUNTER — Emergency Department (HOSPITAL_COMMUNITY)
Admission: EM | Admit: 2017-04-07 | Discharge: 2017-04-07 | Disposition: A | Discharge status: Home / Self Care | Payer: Self-pay | Attending: Emergency Medicine | Admitting: Emergency Medicine

## 2017-04-07 ENCOUNTER — Encounter (HOSPITAL_COMMUNITY): Payer: Self-pay

## 2017-04-07 DIAGNOSIS — Z79899 Other long term (current) drug therapy: Secondary | ICD-10-CM | POA: Insufficient documentation

## 2017-04-07 DIAGNOSIS — K611 Rectal abscess: Secondary | ICD-10-CM | POA: Insufficient documentation

## 2017-04-07 DIAGNOSIS — L0231 Cutaneous abscess of buttock: Secondary | ICD-10-CM

## 2017-04-07 DIAGNOSIS — F17210 Nicotine dependence, cigarettes, uncomplicated: Secondary | ICD-10-CM | POA: Insufficient documentation

## 2017-04-07 MED ORDER — SULFAMETHOXAZOLE-TRIMETHOPRIM 800-160 MG PO TABS
1.0000 | ORAL_TABLET | Freq: Once | ORAL | Status: AC
  Administered 2017-04-07: 1 via ORAL
  Filled 2017-04-07: qty 1

## 2017-04-07 MED ORDER — LIDOCAINE HCL (PF) 1 % IJ SOLN
10.0000 mL | Freq: Once | INTRAMUSCULAR | Status: AC
  Administered 2017-04-07: 10 mL via INTRADERMAL
  Filled 2017-04-07: qty 30

## 2017-04-07 MED ORDER — HYDROCODONE-ACETAMINOPHEN 5-325 MG PO TABS
2.0000 | ORAL_TABLET | Freq: Once | ORAL | Status: AC
  Administered 2017-04-07: 2 via ORAL
  Filled 2017-04-07: qty 2

## 2017-04-07 MED ORDER — HYDROCODONE-ACETAMINOPHEN 5-325 MG PO TABS: 1.0000 | ORAL_TABLET | Freq: Four times a day (QID) | ORAL | 0 refills | Status: DC | PRN

## 2017-04-07 MED ORDER — HYDROCODONE-ACETAMINOPHEN 5-325 MG PO TABS: 2.0000 | ORAL_TABLET | ORAL | 0 refills | Status: DC | PRN

## 2017-04-07 MED ORDER — SULFAMETHOXAZOLE-TRIMETHOPRIM 800-160 MG PO TABS
1.0000 | ORAL_TABLET | Freq: Two times a day (BID) | ORAL | 0 refills | Status: DC
Start: 2017-04-07 — End: 2017-04-07

## 2017-04-07 MED ORDER — HYDROCODONE-ACETAMINOPHEN 5-325 MG PO TABS
1.0000 | ORAL_TABLET | Freq: Once | ORAL | Status: AC
  Administered 2017-04-07: 1 via ORAL
  Filled 2017-04-07: qty 1

## 2017-04-07 MED ORDER — SULFAMETHOXAZOLE-TRIMETHOPRIM 800-160 MG PO TABS: 1.0000 | ORAL_TABLET | Freq: Two times a day (BID) | ORAL | 0 refills | Status: AC

## 2017-04-07 NOTE — ED Notes (Signed)
Dressing applied. 

## 2017-04-07 NOTE — ED Triage Notes (Signed)
Pt c/o abscess between buttocks. Hx of same requiring I&D. A&Ox4. Ambulatory. No fever, no drainage or bleeding.

## 2017-04-07 NOTE — Discharge Instructions (Signed)
Apply warm compresses to the area to help continue express drainage.   Keep the wound clean and dry. Gently wash the wound with soap and water and make sure to pat it dry.   Take antibiotic and complete the entire course.   You can take Tylenol or Ibuprofen as directed for pain.  Followup with the Emergency Department or your PCP in 2 days for wound recheck and packing removal.   Return to the Emergency Department if you experienced any worsening/spreading redness or swelling, fever, worsening pain, or any other worsening or concerning symptoms.

## 2017-04-07 NOTE — ED Provider Notes (Signed)
[facility]-EMERGENCY DEPT Provider Note   CSN: [id number] Arrival date & time: 04/07/17  1713     History   Chief Complaint Chief Complaint  Patient presents with  . Abscess    HPI Paul Osborne is a 41 y.o. male who presents with swelling and pain to buttocks times one week. Patient reports that over the last week, symptoms have progressively worsened prompting ED visits. He tried to express drainage at home but was unable to do so. Patient states pain is worsened with sitting and walking. He has not been taking any medications to help with the pain. Patient reports he has a history of abscess that required I&D. He reports that his last abscess was in July 2018 when he came to the emergency department. He states that he has never had have any surgical removal of his abscesses. Patient denies any fevers, chest pain, difficulty breathing, blood in stool, testicular pain, penile swelling, penile pain.   The history is provided by the patient.    Past Medical History:  Diagnosis Date  . Hypertension     Patient Active Problem List   Diagnosis Date Noted  . Penile cyst 05/19/2015    History reviewed. No pertinent surgical history.     Home Medications    Prior to Admission medications   Medication Sig Start Date End Date Taking? Authorizing Provider  amoxicillin (AMOXIL) 500 MG capsule Take 1 capsule (500 mg total) by mouth 3 (three) times daily. Patient not taking: Reported on 05/19/2015 01/23/15   Patel-Mills, Lorelle Formosa, PA-C  cetirizine (ZYRTEC ALLERGY) 10 MG tablet Take 1 tablet (10 mg total) by mouth daily. Patient not taking: Reported on 01/25/2015 09/08/13   Eber Hong, MD  HYDROcodone-acetaminophen (NORCO/VICODIN) 5-325 MG tablet Take 2 tablets by mouth every 4 (four) hours as needed. 04/07/17   Maxwell Caul, PA-C  ibuprofen (ADVIL,MOTRIN) 800 MG tablet Take 1 tablet (800 mg total) by mouth 3 (three) times daily. Patient not taking: Reported on 05/19/2015 01/25/15    Elpidio Anis, PA-C  naproxen (NAPROSYN) 500 MG tablet Take 1 tablet (500 mg total) by mouth 2 (two) times daily. 11/08/16   Rancour, Jeannett Senior, MD  sulfamethoxazole-trimethoprim (BACTRIM DS,SEPTRA DS) 800-160 MG tablet Take 1 tablet by mouth 2 (two) times daily. 04/07/17 04/14/17  Maxwell Caul, PA-C    Family History History reviewed. No pertinent family history.  Social History Social History  Substance Use Topics  . Smoking status: Current Every Day Smoker    Packs/day: 0.50  . Smokeless tobacco: Never Used  . Alcohol use Yes     Comment: sociable     Allergies   Patient has no known allergies.   Review of Systems Review of Systems  Constitutional: Negative for fever.  Respiratory: Negative for shortness of breath.   Cardiovascular: Negative for chest pain.  Gastrointestinal: Negative for blood in stool.  Skin: Positive for color change and wound.     Physical Exam Updated Vital Signs BP (!) 145/109 (BP Location: Left Arm)   Pulse 80   Temp 98.7 F (37.1 C) (Oral)   Resp 16   SpO2 99%   Physical Exam  Constitutional: He appears well-developed and well-nourished.  Appears uncomfortable but no acute distress  HENT:  Head: Normocephalic and atraumatic.  Eyes: Conjunctivae and EOM are normal. Right eye exhibits no discharge. Left eye exhibits no discharge. No scleral icterus.  Pulmonary/Chest: Effort normal.  Genitourinary: Rectum normal. Rectal exam shows no external hemorrhoid, no fissure, no mass and  no tenderness.     Genitourinary Comments: The exam was performed with a chaperone present. Rectal exam reveals no mass, fluctuance, tenderness.   Neurological: He is alert.  Skin: Skin is warm and dry.  Abscesses as documented in GU exam  Psychiatric: He has a normal mood and affect. His speech is normal and behavior is normal.  Nursing note and vitals reviewed.    ED Treatments / Results  Labs (all labs ordered are listed, but only abnormal results are  displayed) Labs Reviewed - No data to display  EKG  EKG Interpretation None       Radiology No results found.  Procedures .Marland KitchenIncision and Drainage Date/Time: 04/08/2017 12:48 AM Performed by: Graciella Freer A Authorized by: Nira Conn   Consent:    Consent obtained:  Verbal   Consent given by:  Patient   Risks discussed:  Incomplete drainage and infection   Alternatives discussed:  Delayed treatment and observation Location:    Type:  Abscess   Location:  Anogenital   Anogenital location:  Perianal Pre-procedure details:    Skin preparation:  Betadine Anesthesia (see MAR for exact dosages):    Anesthesia method:  Local infiltration   Local anesthetic:  Lidocaine 1% w/o epi Procedure type:    Complexity:  Simple Procedure details:    Incision types:  Single straight and stab incision   Scalpel blade:  11   Wound management:  Probed and deloculated and irrigated with saline   Drainage:  Purulent and bloody   Drainage amount:  Copious   Wound treatment:  Drain placed   Packing materials:  1/4 in iodoform gauze Post-procedure details:    Patient tolerance of procedure:  Tolerated well, no immediate complications   (including critical care time)  Medications Ordered in ED Medications  lidocaine (PF) (XYLOCAINE) 1 % injection 10 mL (10 mLs Intradermal Given 04/07/17 1859)  HYDROcodone-acetaminophen (NORCO/VICODIN) 5-325 MG per tablet 2 tablet (2 tablets Oral Given 04/07/17 1859)  sulfamethoxazole-trimethoprim (BACTRIM DS,SEPTRA DS) 800-160 MG per tablet 1 tablet (1 tablet Oral Given 04/07/17 2155)  HYDROcodone-acetaminophen (NORCO/VICODIN) 5-325 MG per tablet 1 tablet (1 tablet Oral Given 04/07/17 2154)     Initial Impression / Assessment and Plan / ED Course  I have reviewed the triage vital signs and the nursing notes.  Pertinent labs & imaging results that were available during my care of the patient were reviewed by me and considered in my medical  decision making (see chart for details).     41 year old male with past medical history of abscesses who presents with pain and swelling to the buttocks times one week. No fevers/chills. Patient is afebrile, non-toxic appearing, sitting comfortably on examination table. Vital signs reviewed and stable. Patient is slightly hypertensive, likely secondary to pain. Rectal exam shows no mass, fluctuance, tenderness. Physical exam is consistent with perianal abscess. Analgesics provided in the department.  Bedside ultrasound performed that confirms presence of perianal abscess. Will plan to I&D in the department. Abscess does not appear to extend into the scrotum.   I&D is performed above. Patient with copious amounts of bloody and purulent material. The wound was probed and deloculated. The abscess was big enough to ward a drain which was placed. Instructed patient that he will have to return to the emergency Department in 2 days to have the drain removed and for wound recheck. Patient tolerated procedure well. Given the extent of the abscess in proximity to the anus, will place on prophylactic antibiotics.  Patient  able to ambulate in the department with some minor pain. Vital signs reviewed. Patient is still slightly hypertensive, likely secondary to pain. Otherwise vitals remained stable. Wound care instructions given to patient. He is instructed to follow-up in the emergency Department in 2 days for drain removal and wound recheck. Provided patient with a list of clinic resources to use if he does not have a PCP. Instructed to call them today to arrange follow-up in the next 24-48 hours. Strict return precautions discussed. Patient expresses understanding and agreement to plan.   EMERGENCY DEPARTMENT US SOFT TISSUE INTERPRETATION "Study: Limited Soft Tissue Ultrasound"  INDICATIONS: Pain and Soft tissue infection Multiple views of the body part were obtained in real-time with a multi-frequency linear  probe  PERFORMED BY: Myself IMAGES ARCHIVED?: Yes SIDE:Midline BODY PART:buttock INTERPRETATION:  Abcess present     Final Clinical Impressions(s) / ED Diagnoses   Final diagnoses:  Abscess of buttock, right    New Prescriptions Discharge Medication List as of 04/07/2017  9:35 PM    START taking these medications   Details  HYDROcodone-acetaminophen (NORCO/VICODIN) 5-325 MG tablet Take 2 tablets by mouth every 4 (four) hours as needed., Starting Sat 04/07/2017, Print    sulfamethoxazole-trimethoprim (BACTRIM DS,SEPTRA DS) 800-160 MG tablet Take 1 tablet by mouth 2 (two) times daily., Starting Sat 04/07/2017, Until Sat 04/14/2017, Print         Maxwell Caul, PA-C 04/08/17 0054    Nira Conn, MD 04/08/17 [phone]

## 2017-04-09 ENCOUNTER — Emergency Department (HOSPITAL_COMMUNITY)
Admission: EM | Admit: 2017-04-09 | Discharge: 2017-04-09 | Disposition: A | Discharge status: Home / Self Care | Payer: Self-pay | Attending: Emergency Medicine | Admitting: Emergency Medicine

## 2017-04-09 ENCOUNTER — Encounter (HOSPITAL_COMMUNITY): Payer: Self-pay | Admitting: *Deleted

## 2017-04-09 DIAGNOSIS — F17200 Nicotine dependence, unspecified, uncomplicated: Secondary | ICD-10-CM | POA: Insufficient documentation

## 2017-04-09 DIAGNOSIS — Z79899 Other long term (current) drug therapy: Secondary | ICD-10-CM | POA: Insufficient documentation

## 2017-04-09 DIAGNOSIS — I10 Essential (primary) hypertension: Secondary | ICD-10-CM | POA: Insufficient documentation

## 2017-04-09 DIAGNOSIS — Z09 Encounter for follow-up examination after completed treatment for conditions other than malignant neoplasm: Secondary | ICD-10-CM | POA: Insufficient documentation

## 2017-04-09 NOTE — ED Notes (Signed)
Pt ambulatory and independent at discharge.  Verbalized understanding of discharge instructions 

## 2017-04-09 NOTE — ED Provider Notes (Signed)
[facility]-EMER[facility] DEPT Provider Note   CSN: [id number] Arrival date & time: 04/09/17  1532     History   Chief Complaint Chief Complaint  Patient presents with  . Wound Check    HPI Paul Osborne is a 41 y.o. male who presents to the ED for wound check and packing removal. Patient reports no problems since I&D.   HPI  Past Medical History:  Diagnosis Date  . Hypertension     Patient Active Problem List   Diagnosis Date Noted  . Penile cyst 05/19/2015    History reviewed. No pertinent surgical history.     Home Medications    Prior to Admission medications   Medication Sig Start Date End Date Taking? Authorizing Provider  HYDROcodone-acetaminophen (NORCO/VICODIN) 5-325 MG tablet Take 2 tablets by mouth every 4 (four) hours as needed. 04/07/17   Maxwell Caul, PA-C  ibuprofen (ADVIL,MOTRIN) 800 MG tablet Take 1 tablet (800 mg total) by mouth 3 (three) times daily. Patient not taking: Reported on 05/19/2015 01/25/15   Elpidio Anis, PA-C  naproxen (NAPROSYN) 500 MG tablet Take 1 tablet (500 mg total) by mouth 2 (two) times daily. 11/08/16   Rancour, Jeannett Senior, MD  sulfamethoxazole-trimethoprim (BACTRIM DS,SEPTRA DS) 800-160 MG tablet Take 1 tablet by mouth 2 (two) times daily. 04/07/17 04/14/17  Maxwell Caul, PA-C    Family History No family history on file.  Social History Social History  Substance Use Topics  . Smoking status: Current Every Day Smoker    Packs/day: 0.50  . Smokeless tobacco: Never Used  . Alcohol use Yes     Comment: sociable     Allergies   Patient has no known allergies.   Review of Systems Review of Systems  Constitutional: Negative for chills and fever.  HENT: Negative.   Skin: Positive for wound.  Psychiatric/Behavioral: The patient is not nervous/anxious.      Physical Exam Updated Vital Signs BP 125/89 (BP Location: Left Arm)   Pulse 82   Temp 98.3 F (36.8 C) (Oral)   Resp 18   SpO2 100%   Physical Exam    Constitutional: He is oriented to person, place, and time. He appears well-developed and well-nourished.  HENT:  Head: Normocephalic.  Eyes: EOM are normal.  Neck: Neck supple.  Cardiovascular: Normal rate.   Pulmonary/Chest: Effort normal.  Genitourinary:  Genitourinary Comments: Packing removed from I&D site without difficulty.   Musculoskeletal: Normal range of motion.  Neurological: He is alert and oriented to person, place, and time. No cranial nerve deficit.  Skin: Skin is warm and dry.  Psychiatric: He has a normal mood and affect.  Nursing note and vitals reviewed.    ED Treatments / Results  Labs (all labs ordered are listed, but only abnormal results are displayed) Labs Reviewed - No data to display  Radiology No results found.  Procedures Procedures (including critical care time)  Medications Ordered in ED Medications - No data to display   Initial Impression / Assessment and Plan / ED Course  I have reviewed the triage vital signs and the nursing notes.   Final Clinical Impressions(s) / ED Diagnoses  41 y.o. male here for wound check and packing removal stable for d/c without fever or any problems.   Final diagnoses:  Encounter for recheck of abscess following incision and drainage    New Prescriptions Current Discharge Medication List       Kerrie Buffalo Malone, NP 04/09/17 1721    Rolan Bucco, MD 04/09/17  2252  

## 2017-04-09 NOTE — ED Triage Notes (Addendum)
Pt here for wound check after having abscess on buttocks drained 2 days ago. Pt still has packing from procedure.

## 2017-04-09 NOTE — Discharge Instructions (Signed)
Continue your medications and take ibuprofen in addition. Site in warm tubs of water or apply warm wet compresses to the area to help the drainage continue. Return as needed for any problems.

## 2017-04-18 IMAGING — DX DG CHEST 2V
2 series · 2 of 2 positions shown · non-contrast
Comparison: 11/20/2008

CLINICAL DATA: Upper right side chest pain for 2 days

EXAM:
CHEST  2 VIEW

[chest pa]
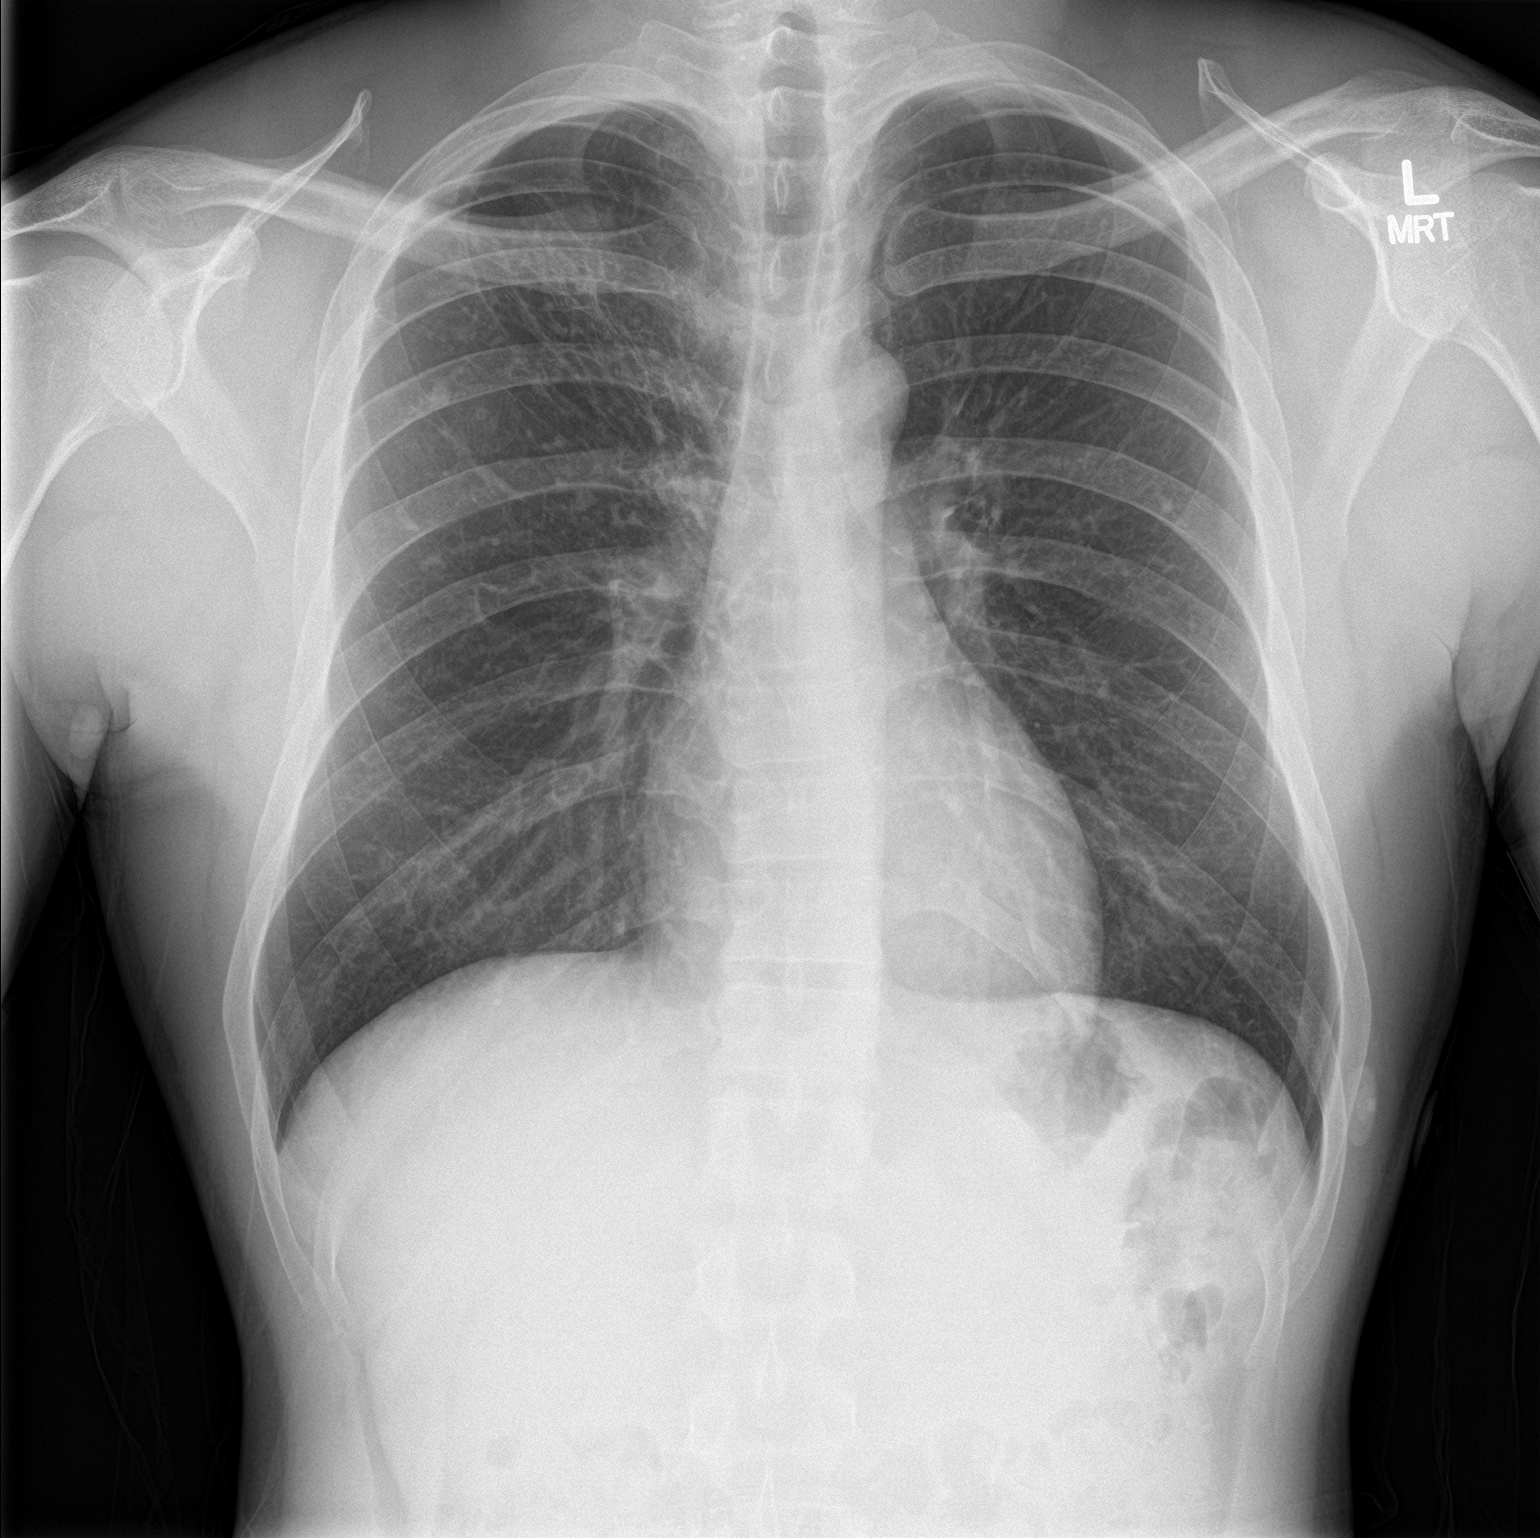

[chest lat]
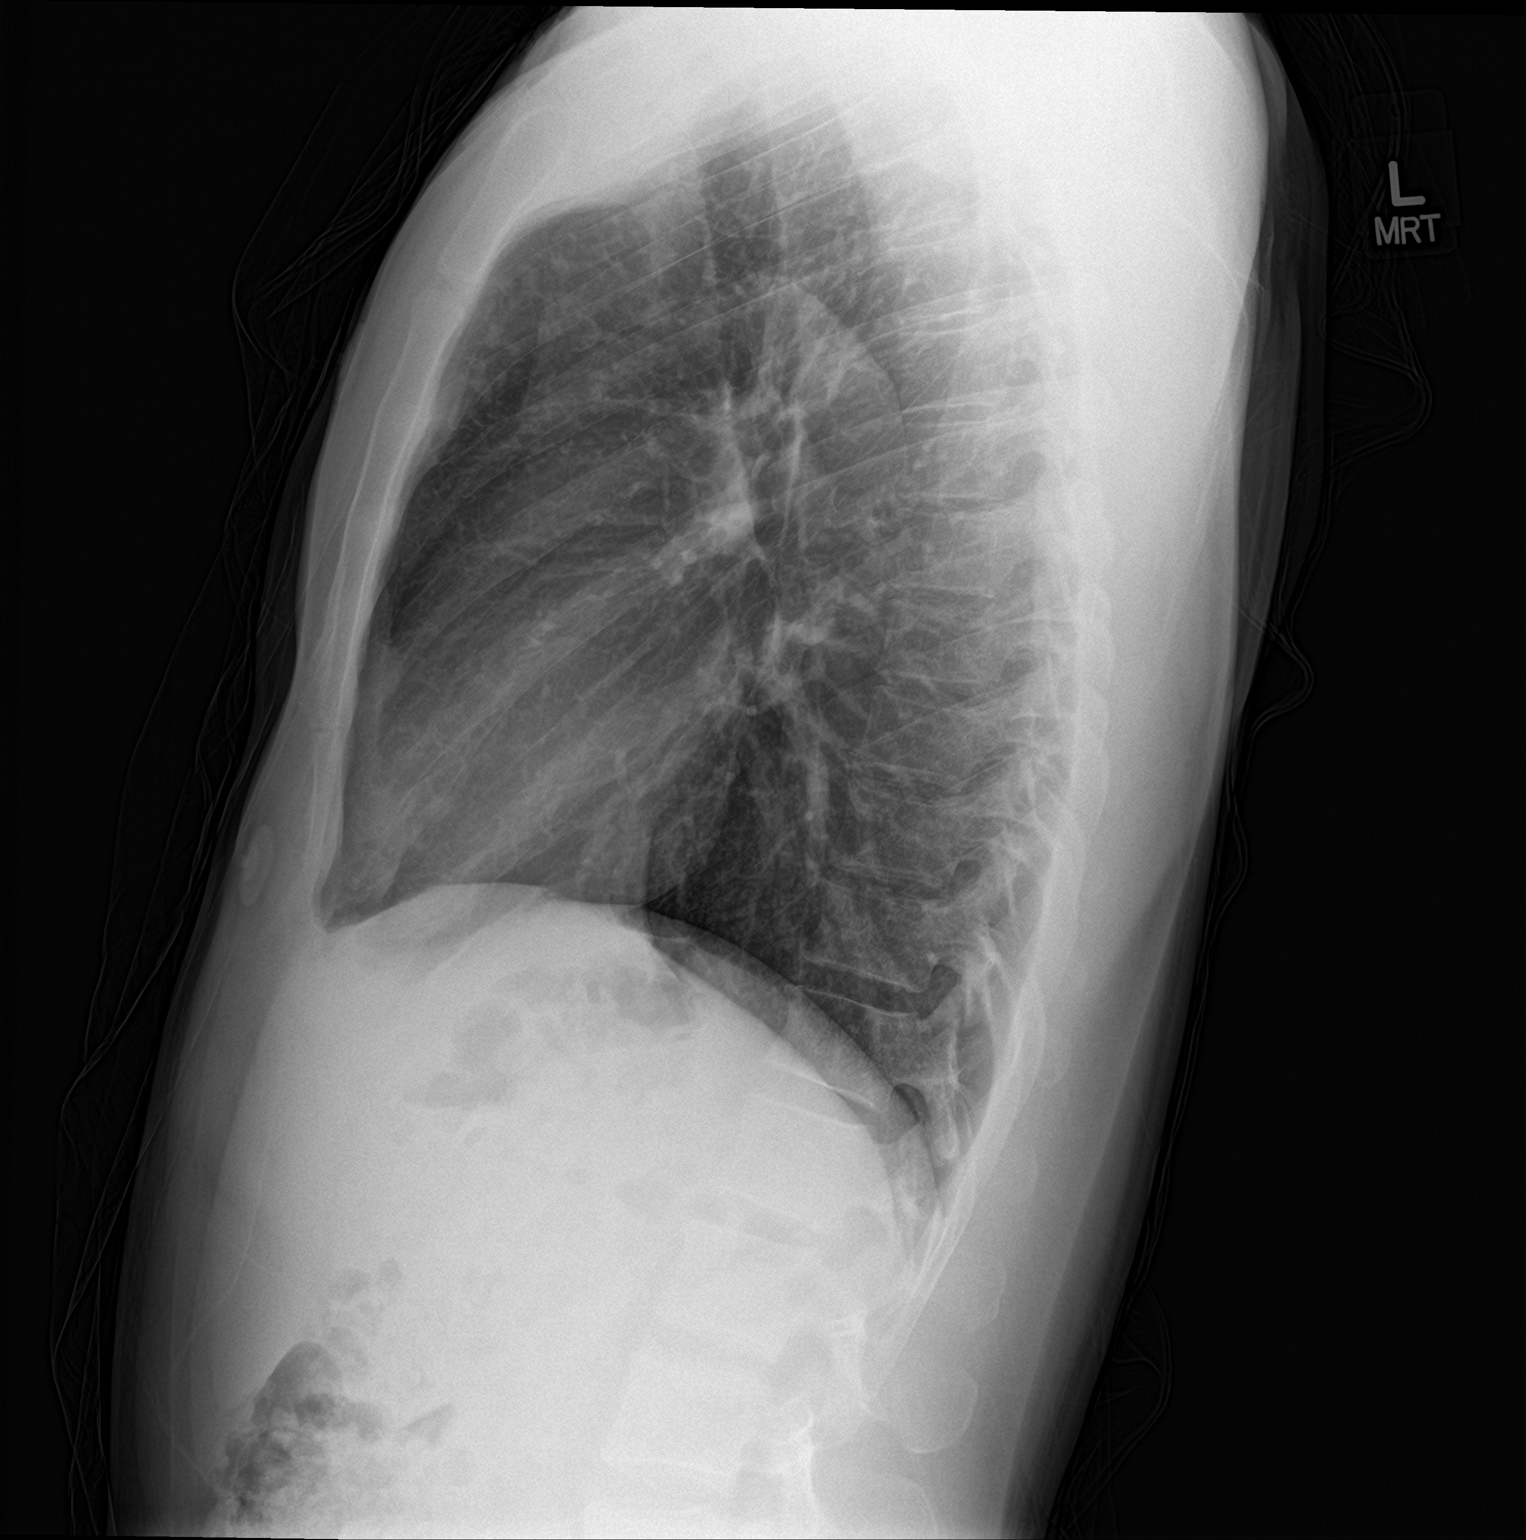

[2 of 2 positions shown; findings below may reference images not displayed]

FINDINGS: Cardiomediastinal silhouette is unremarkable. No infiltrate or
pulmonary edema. Stable reticulonodular scarring right upper lobe in
this patient with known history of TB.
IMPRESSION: No infiltrate or pulmonary edema. Stable reticulonodular scarring
right upper lobe in this patient with known history of TB.

## 2017-06-11 ENCOUNTER — Encounter (HOSPITAL_COMMUNITY): Payer: Self-pay | Admitting: *Deleted

## 2017-06-11 ENCOUNTER — Emergency Department (HOSPITAL_COMMUNITY)
Admission: EM | Admit: 2017-06-11 | Discharge: 2017-06-11 | Disposition: A | Discharge status: Home / Self Care | Payer: Self-pay | Attending: Emergency Medicine | Admitting: Emergency Medicine

## 2017-06-11 DIAGNOSIS — F17210 Nicotine dependence, cigarettes, uncomplicated: Secondary | ICD-10-CM | POA: Insufficient documentation

## 2017-06-11 DIAGNOSIS — L02215 Cutaneous abscess of perineum: Secondary | ICD-10-CM | POA: Insufficient documentation

## 2017-06-11 DIAGNOSIS — Z79899 Other long term (current) drug therapy: Secondary | ICD-10-CM | POA: Insufficient documentation

## 2017-06-11 MED ORDER — OXYCODONE-ACETAMINOPHEN 5-325 MG PO TABS: 1.0000 | ORAL_TABLET | Freq: Four times a day (QID) | ORAL | 0 refills | Status: DC | PRN

## 2017-06-11 MED ORDER — CHLORHEXIDINE GLUCONATE 4 % EX LIQD: Freq: Every day | CUTANEOUS | 0 refills | Status: DC | PRN

## 2017-06-11 MED ORDER — OXYCODONE-ACETAMINOPHEN 5-325 MG PO TABS
2.0000 | ORAL_TABLET | Freq: Once | ORAL | Status: AC
Start: 2017-06-11 — End: 2017-06-11
  Administered 2017-06-11: 2 via ORAL
  Filled 2017-06-11: qty 2

## 2017-06-11 MED ORDER — SULFAMETHOXAZOLE-TRIMETHOPRIM 800-160 MG PO TABS
1.0000 | ORAL_TABLET | Freq: Once | ORAL | Status: AC
  Administered 2017-06-11: 1 via ORAL
  Filled 2017-06-11: qty 1

## 2017-06-11 MED ORDER — IBUPROFEN 800 MG PO TABS: 800.0000 mg | ORAL_TABLET | Freq: Three times a day (TID) | ORAL | 0 refills | Status: DC | PRN

## 2017-06-11 MED ORDER — SULFAMETHOXAZOLE-TRIMETHOPRIM 800-160 MG PO TABS: 1.0000 | ORAL_TABLET | Freq: Two times a day (BID) | ORAL | 0 refills | Status: AC

## 2017-06-11 MED ORDER — LIDOCAINE HCL (PF) 1 % IJ SOLN
30.0000 mL | Freq: Once | INTRAMUSCULAR | Status: AC
  Administered 2017-06-11: 30 mL via INTRADERMAL
  Filled 2017-06-11: qty 30

## 2017-06-11 NOTE — ED Triage Notes (Signed)
Pt states "have been seen for an abscess near my anus, it was drained but never completely.  It's so painful."

## 2017-06-11 NOTE — ED Provider Notes (Signed)
TIME SEEN: 3:30 AM  CHIEF COMPLAINT: Rectal abscess  HPI: Patient is a 41 year old male with no significant past medical history who presents to the emergency department with rectal abscess.  Has had the same abscess several times in the past.  Reports it came back 3 days ago.  No drainage.  No fevers, abdominal pain, vomiting or diarrhea.  No bloody stools or melena.  He has not a diabetic or immunocompromised.  ROS: See HPI Constitutional: no fever  Eyes: no drainage  ENT: no runny nose   Cardiovascular:  no chest pain  Resp: no SOB  GI: no vomiting GU: no dysuria Integumentary: no rash  Allergy: no hives  Musculoskeletal: no leg swelling  Neurological: no slurred speech ROS otherwise negative  PAST MEDICAL HISTORY/PAST SURGICAL HISTORY:  Past Medical History:  Diagnosis Date  . Hypertension    Pt denies    MEDICATIONS:  Prior to Admission medications   Medication Sig Start Date End Date Taking? Authorizing Provider  HYDROcodone-acetaminophen (NORCO/VICODIN) 5-325 MG tablet Take 2 tablets by mouth every 4 (four) hours as needed. 04/07/17   Maxwell Caul, PA-C  ibuprofen (ADVIL,MOTRIN) 800 MG tablet Take 1 tablet (800 mg total) by mouth 3 (three) times daily. Patient not taking: Reported on 05/19/2015 01/25/15   Elpidio Anis, PA-C  naproxen (NAPROSYN) 500 MG tablet Take 1 tablet (500 mg total) by mouth 2 (two) times daily. 11/08/16   Glynn Octave, MD    ALLERGIES:  No Known Allergies  SOCIAL HISTORY:  Social History  Substance Use Topics  . Smoking status: Current Every Day Smoker    Packs/day: 0.50  . Smokeless tobacco: Never Used  . Alcohol use Yes     Comment: sociable    FAMILY HISTORY: No family history on file.  EXAM: BP 130/88 (BP Location: Left Arm)   Pulse 92   Temp 98.5 F (36.9 C) (Oral)   Resp 16   Ht 5\' 10"  (1.778 m)   Wt 69.3 kg (152 lb 12.8 oz)   BMI 21.92 kg/m  CONSTITUTIONAL: Alert and oriented and responds appropriately to  questions. Well-appearing; well-nourished HEAD: Normocephalic EYES: Conjunctivae clear, pupils appear equal, EOMI ENT: normal nose; moist mucous membranes NECK: Supple, no meningismus, no nuchal rigidity, no LAD  CARD: RRR; S1 and S2 appreciated; no murmurs, no clicks, no rubs, no gallops RESP: Normal chest excursion without splinting or tachypnea; breath sounds clear and equal bilaterally; no wheezes, no rhonchi, no rales, no hypoxia or respiratory distress, speaking full sentences ABD/GI: Normal bowel sounds; non-distended; soft, non-tender, no rebound, no guarding, no peritoneal signs, no hepatosplenomegaly RECTAL: Large area of fluctuance in the perineum just next to the rectum but does not go into the rectal vault, no drainage, no erythema or warmth, no crepitus, scrotum appears normal, otherwise normal rectal exam without tenderness or fluctuance or fullness, normal soft brown stool in the rectal vault without blood or melena BACK:  The back appears normal and is non-tender to palpation, there is no CVA tenderness EXT: Normal ROM in all joints; non-tender to palpation; no edema; normal capillary refill; no cyanosis, no calf tenderness or swelling    SKIN: Normal color for age and race; warm; no rash NEURO: Moves all extremities equally PSYCH: The patient's mood and manner are appropriate. Grooming and personal hygiene are appropriate.  MEDICAL DECISION MAKING: Patient here with perirectal abscess.  No sign of Fournier's gangrene, necrotizing fasciitis.  No systemic symptoms.  Not immunocompromised.  Does not go into the  rectum.  Will give Bactrim, pain medication.  Will incise and drain the abscess and send wound culture.  ED PROGRESS: Incision and drainage performed.  Patient poorly tolerant of this procedure.  2 incisions made and packing placed in 1 of the incisions.  Will give outpatient surgery follow-up with this continues to recur.  Will discharge on Bactrim and with pain medication.   Will provide work note.  Recommended he soak in a warm tub several times a day for the next several days.  Discussed return precautions.  Wound culture has been sent.   At this time, I do not feel there is any life-threatening condition present. I have reviewed and discussed all results (EKG, imaging, lab, urine as appropriate) and exam findings with patient/family. I have reviewed nursing notes and appropriate previous records.  I feel the patient is safe to be discharged home without further emergent workup and can continue workup as an outpatient as needed. Discussed usual and customary return precautions. Patient/family verbalize understanding and are comfortable with this plan.  Outpatient follow-up has been provided if needed. All questions have been answered.     INCISION AND DRAINAGE Performed by: Raelyn NumberWARD, KRISTEN N Consent: Verbal consent obtained. Risks and benefits: risks, benefits and alternatives were discussed Type: abscess  Body area: Perineum  Anesthesia: local infiltration  Incision was made with a scalpel.  Local anesthetic: lidocaine 1 % without epinephrine  Anesthetic total: 7 ml  Complexity: complex Blunt dissection to break up loculations  Drainage: purulent  Drainage amount: moderate  Packing material: 1/4 in iodoform gauze  Patient tolerance: Patient tolerated the procedure well with no immediate complications.      Ward, Layla MawKristen N, DO 06/11/17 [phone]

## 2017-06-11 NOTE — Discharge Instructions (Signed)
To find a primary care or specialty doctor please call [phone] or [phone] to access "[facility] Find a Doctor Service." ° °You may also go on the [facility] website at [email or web address] ° °There are also multiple [facility], [facility] practices throughout the [place] that are frequently accepting new patients. You may find a clinic that is close to your home and contact them. ° °[facility] -  °[address] °[place] [address] °[phone] ° ° °[facility] -  °[address] °[place] [address] [phone] ° ° °[facility] - °[address]  °[place] [address] °[phone] ° ° °

## 2017-06-18 LAB — AEROBIC CULTURE  (SUPERFICIAL SPECIMEN)

## 2017-06-18 LAB — AEROBIC CULTURE W GRAM STAIN (SUPERFICIAL SPECIMEN)

## 2018-08-19 ENCOUNTER — Emergency Department (HOSPITAL_COMMUNITY)
Admission: EM | Admit: 2018-08-19 | Discharge: 2018-08-20 | Disposition: A | Discharge status: Home / Self Care | Payer: Self-pay | Attending: Emergency Medicine | Admitting: Emergency Medicine

## 2018-08-19 ENCOUNTER — Encounter (HOSPITAL_COMMUNITY): Payer: Self-pay | Admitting: Emergency Medicine

## 2018-08-19 DIAGNOSIS — R509 Fever, unspecified: Secondary | ICD-10-CM | POA: Insufficient documentation

## 2018-08-19 DIAGNOSIS — R05 Cough: Secondary | ICD-10-CM | POA: Insufficient documentation

## 2018-08-19 DIAGNOSIS — R51 Headache: Secondary | ICD-10-CM | POA: Insufficient documentation

## 2018-08-19 DIAGNOSIS — Z5321 Procedure and treatment not carried out due to patient leaving prior to being seen by health care provider: Secondary | ICD-10-CM | POA: Insufficient documentation

## 2018-08-19 NOTE — ED Triage Notes (Signed)
Pt reports flu like S/S X1 week. Cough, body aches, HA, chills, etc. Low grade fever in triage.

## 2019-12-08 ENCOUNTER — Other Ambulatory Visit: Payer: Self-pay

## 2019-12-08 ENCOUNTER — Emergency Department (HOSPITAL_COMMUNITY)
Admission: EM | Admit: 2019-12-08 | Discharge: 2019-12-09 | Discharge status: Against Medical Advice | Payer: Self-pay | Attending: Emergency Medicine | Admitting: Emergency Medicine

## 2019-12-08 ENCOUNTER — Encounter (HOSPITAL_COMMUNITY): Payer: Self-pay | Admitting: *Deleted

## 2019-12-08 DIAGNOSIS — K0889 Other specified disorders of teeth and supporting structures: Secondary | ICD-10-CM | POA: Insufficient documentation

## 2019-12-08 DIAGNOSIS — Z5321 Procedure and treatment not carried out due to patient leaving prior to being seen by health care provider: Secondary | ICD-10-CM | POA: Insufficient documentation

## 2019-12-08 NOTE — ED Triage Notes (Signed)
Pt reports having right side dental pain and broken tooth. Pain increased 2 days ago and now has mild swelling. Airway intact.

## 2019-12-09 ENCOUNTER — Encounter (HOSPITAL_COMMUNITY): Payer: Self-pay | Admitting: Student

## 2019-12-09 ENCOUNTER — Emergency Department (HOSPITAL_COMMUNITY)
Admission: EM | Admit: 2019-12-09 | Discharge: 2019-12-09 | Disposition: A | Discharge status: Home / Self Care | Payer: Self-pay | Attending: Emergency Medicine | Admitting: Emergency Medicine

## 2019-12-09 DIAGNOSIS — F17210 Nicotine dependence, cigarettes, uncomplicated: Secondary | ICD-10-CM | POA: Insufficient documentation

## 2019-12-09 DIAGNOSIS — I10 Essential (primary) hypertension: Secondary | ICD-10-CM | POA: Insufficient documentation

## 2019-12-09 DIAGNOSIS — Z79899 Other long term (current) drug therapy: Secondary | ICD-10-CM | POA: Insufficient documentation

## 2019-12-09 DIAGNOSIS — K0889 Other specified disorders of teeth and supporting structures: Secondary | ICD-10-CM | POA: Insufficient documentation

## 2019-12-09 MED ORDER — AMOXICILLIN-POT CLAVULANATE 875-125 MG PO TABS: 1.0000 | ORAL_TABLET | Freq: Two times a day (BID) | ORAL | 0 refills | Status: DC

## 2019-12-09 MED ORDER — NAPROXEN 500 MG PO TABS: 500.0000 mg | ORAL_TABLET | Freq: Two times a day (BID) | ORAL | 0 refills | Status: DC

## 2019-12-09 NOTE — ED Triage Notes (Signed)
Pt reports he has dental pain that increased in severity and he has a chipped tooth

## 2019-12-09 NOTE — Discharge Instructions (Addendum)
Call one of the dentists offices provided to schedule an appointment for re-evaluation and further management within the next 48 hours.  ° °I have prescribed you Augmentin which is an antibiotic to treat the infection and Naproxen which is an anti-inflammatory medicine to treat the pain.  ° °Please take all of your antibiotics until finished. You may develop abdominal discomfort or diarrhea from the antibiotic.  You may help offset this with probiotics which you can buy at the store (ask your pharmacist if unable to find) or get probiotics in the form of eating yogurt. Do not eat or take the probiotics until 2 hours after your antibiotic. If you are unable to tolerate these side effects follow-up with your primary care provider or return to the emergency department.  ° °If you begin to experience any blistering, rashes, swelling, or difficulty breathing seek medical care for evaluation of potentially more serious side effects.  ° °Be sure to eat something when taking the Naproxen as it can cause stomach upset and at worst stomach bleeding. Do not take additional non steroidal anti-inflammatory medicines such as Ibuprofen, Aleve, Advil, Mobic, Diclofenac, or goodie powder while taking Naproxen. You may supplement with Tylenol.  ° °We have prescribed you new medication(s) today. Discuss the medications prescribed today with your pharmacist as they can have adverse effects and interactions with your other medicines including over the counter and prescribed medications. Seek medical evaluation if you start to experience new or abnormal symptoms after taking one of these medicines, seek care immediately if you start to experience difficulty breathing, feeling of your throat closing, facial swelling, or rash as these could be indications of a more serious allergic reaction ° °If you start to experience and new or worsening symptoms return to the emergency department. If you start to experience fever, chills, neck  stiffness/pain, or inability to move your neck or open your mouth come back to the emergency department immediately.  ° °

## 2019-12-09 NOTE — ED Provider Notes (Signed)
[facility]-EMERGENCY DEPT Provider Note   CSN: [id number] Arrival date & time: 12/09/19  1120     History Chief Complaint  Patient presents with  . Dental Pain    Paul Osborne is a 44 y.o. male with a hx of tobacco abuse & hypertension who presents to the ED with complaints of dental pain for the past few days. Patient states he is having pain/swelling to teeth in the right lower jaw, constant, severe, worse with trying to chew, no alleviating factors. Tried motrin without relief. History of problems with same tooth.  He is not currently seeing a dentist.  He denies fever, chills, nausea, vomiting, neck pain/stiffness, intraoral drainage, or pain/swelling beneath the tongue.  HPI     Past Medical History:  Diagnosis Date  . Hypertension    Pt denies    Patient Active Problem List   Diagnosis Date Noted  . Penile cyst 05/19/2015    History reviewed. No pertinent surgical history.     History reviewed. No pertinent family history.  Social History   Tobacco Use  . Smoking status: Current Every Day Smoker    Packs/day: 0.50  . Smokeless tobacco: Never Used  Substance Use Topics  . Alcohol use: Yes    Comment: Socially   . Drug use: Yes    Types: Marijuana    Home Medications Prior to Admission medications   Medication Sig Start Date End Date Taking? Authorizing Provider  chlorhexidine (HIBICLENS) 4 % external liquid Apply topically daily as needed. 06/11/17   Ward, Layla Maw, DO  ibuprofen (ADVIL,MOTRIN) 800 MG tablet Take 1 tablet (800 mg total) by mouth every 8 (eight) hours as needed for mild pain. 06/11/17   Ward, Layla Maw, DO  oxyCODONE-acetaminophen (PERCOCET/ROXICET) 5-325 MG tablet Take 1-2 tablets by mouth every 6 (six) hours as needed. 06/11/17   Ward, Layla Maw, DO    Allergies    Patient has no known allergies.  Review of Systems   Review of Systems  Constitutional: Negative for chills and fever.  HENT: Positive for  dental problem and facial swelling. Negative for congestion, ear pain, sore throat, trouble swallowing and voice change.   Respiratory: Negative for shortness of breath.   Cardiovascular: Negative for chest pain.  Gastrointestinal: Negative for abdominal pain, nausea and vomiting.  Musculoskeletal: Negative for neck pain and neck stiffness.    Physical Exam Updated Vital Signs BP (!) 144/106 (BP Location: Right Arm)   Pulse 78   Temp 99 F (37.2 C)   Resp 16   SpO2 99%   Physical Exam Vitals and nursing note reviewed.  Constitutional:      General: He is not in acute distress.    Appearance: He is well-developed. He is not toxic-appearing.  HENT:     Head: Normocephalic and atraumatic.     Right Ear: Tympanic membrane is not perforated, erythematous, retracted or bulging.     Left Ear: Tympanic membrane is not perforated, erythematous, retracted or bulging.     Nose: Nose normal.     Mouth/Throat:     Pharynx: Uvula midline. No oropharyngeal exudate or posterior oropharyngeal erythema.      Comments: Poor dentition throughout. Posterior oropharynx is symmetric appearing. Patient tolerating own secretions without difficulty. No trismus. No drooling. No hot potato voice. No swelling beneath the tongue, submandibular compartment is soft.  Eyes:     General:        Right eye: No discharge.  Left eye: No discharge.     Conjunctiva/sclera: Conjunctivae normal.  Cardiovascular:     Rate and Rhythm: Normal rate and regular rhythm.     Comments: No murmur.  Pulmonary:     Effort: Pulmonary effort is normal.     Breath sounds: Normal breath sounds.  Musculoskeletal:     Cervical back: Normal range of motion and neck supple. No edema, erythema, rigidity or crepitus. No pain with movement or muscular tenderness.  Lymphadenopathy:     Cervical: No cervical adenopathy.  Neurological:     Mental Status: He is alert.  Psychiatric:        Behavior: Behavior normal.         Thought Content: Thought content normal.     ED Results / Procedures / Treatments   Labs (all labs ordered are listed, but only abnormal results are displayed) Labs Reviewed - No data to display  EKG None  Radiology No results found.  Procedures Procedures (including critical care time)  Medications Ordered in ED Medications - No data to display  ED Course  I have reviewed the triage vital signs and the nursing notes.  Pertinent labs & imaging results that were available during my care of the patient were reviewed by me and considered in my medical decision making (see chart for details).    MDM Rules/Calculators/A&P                      Patient presents with dental pain. Patient is nontoxic appearing, vitals without significant abnormality-BP elevated, doubt HTN emergency.  Exam is concerning for infection some mild associated facial swelling.  Probable induration present, no palpable fluctuance amenable to emergency department incision and drainage.  Based on exam have a low suspicion for Ludwig's angina or deep space infection.  Will treat with Augmentin and Naproxen.  Urged patient to follow-up with dentist, dental resources were provided.  Discussed treatment plan and need for follow up as well as return precautions. Provided opportunity for questions, patient confirmed understanding and is agreeable to plan.  Final Clinical Impression(s) / ED Diagnoses Final diagnoses:  Pain, dental    Rx / DC Orders ED Discharge Orders         Ordered    amoxicillin-clavulanate (AUGMENTIN) 875-125 MG tablet  Every 12 hours     12/09/19 1201    naproxen (NAPROSYN) 500 MG tablet  2 times daily     12/09/19 [address] [place], PA-C 12/09/19 1304    Lacretia Leigh, MD 12/10/19 1547

## 2020-10-29 ENCOUNTER — Encounter (HOSPITAL_COMMUNITY): Payer: Self-pay

## 2020-10-29 ENCOUNTER — Emergency Department (HOSPITAL_COMMUNITY)
Admission: EM | Admit: 2020-10-29 | Discharge: 2020-10-29 | Disposition: A | Discharge status: Home / Self Care | Payer: Self-pay | Attending: Emergency Medicine | Admitting: Emergency Medicine

## 2020-10-29 ENCOUNTER — Other Ambulatory Visit: Payer: Self-pay

## 2020-10-29 DIAGNOSIS — F17200 Nicotine dependence, unspecified, uncomplicated: Secondary | ICD-10-CM | POA: Insufficient documentation

## 2020-10-29 DIAGNOSIS — H6122 Impacted cerumen, left ear: Secondary | ICD-10-CM | POA: Insufficient documentation

## 2020-10-29 DIAGNOSIS — H7292 Unspecified perforation of tympanic membrane, left ear: Secondary | ICD-10-CM | POA: Insufficient documentation

## 2020-10-29 MED ORDER — DOCUSATE SODIUM 50 MG/5ML PO LIQD
50.0000 mg | Freq: Once | ORAL | Status: AC
  Administered 2020-10-29: 50 mg via OTIC
  Filled 2020-10-29 (×2): qty 10

## 2020-10-29 MED ORDER — CIPROFLOXACIN-DEXAMETHASONE 0.3-0.1 % OT SUSP
4.0000 [drp] | Freq: Once | OTIC | Status: AC
  Administered 2020-10-29: 4 [drp] via OTIC
  Filled 2020-10-29: qty 7.5

## 2020-10-29 NOTE — ED Provider Notes (Signed)
[facility] EMERGENCY DEPARTMENT Provider Note   CSN: [id number] Arrival date & time: 10/29/20  9373     History Chief Complaint  Patient presents with  . Ear Fullness    Paul Osborne is a 45 y.o. male with no significant past medical history presents for evaluation of left ear fullness.  Started a few hours ago.  Has history of cerumen impactions.  States feels similar.  has muffled hearing to his left ear.  No pain, complaints or drainage to right ear.  No headache, lightheadedness, dizziness, ear drainage, tenderness to mastoid, neck pain, neck stiffness. No recent swimming activity. He is tolerating p.o. intake at home without difficulty.  Denies any paresthesias or weakness.  Denies additional aggravating or alleviating factors.  Has not take anything for symptoms.  History obtained from patient and past medical records.  No interpreter used  HPI     Past Medical History:  Diagnosis Date  . Hypertension    Pt denies    Patient Active Problem List   Diagnosis Date Noted  . Penile cyst 05/19/2015    History reviewed. No pertinent surgical history.     History reviewed. No pertinent family history.  Social History   Tobacco Use  . Smoking status: Current Every Day Smoker    Packs/day: 0.50  . Smokeless tobacco: Never Used  Substance Use Topics  . Alcohol use: Yes    Comment: Socially   . Drug use: Yes    Types: Marijuana    Home Medications Prior to Admission medications   Medication Sig Start Date End Date Taking? Authorizing Provider  amoxicillin-clavulanate (AUGMENTIN) 875-125 MG tablet Take 1 tablet by mouth every 12 (twelve) hours. 12/09/19   Petrucelli, Samantha R, PA-C  chlorhexidine (HIBICLENS) 4 % external liquid Apply topically daily as needed. 06/11/17   Ward, Layla Maw, DO  naproxen (NAPROSYN) 500 MG tablet Take 1 tablet (500 mg total) by mouth 2 (two) times daily. 12/09/19   Petrucelli, Pleas Koch, PA-C   oxyCODONE-acetaminophen (PERCOCET/ROXICET) 5-325 MG tablet Take 1-2 tablets by mouth every 6 (six) hours as needed. 06/11/17   Ward, Layla Maw, DO    Allergies    Patient has no known allergies.  Review of Systems   Review of Systems  Constitutional: Negative.   HENT: Positive for ear pain. Negative for congestion, ear discharge, facial swelling, nosebleeds, postnasal drip, rhinorrhea, sinus pressure, sinus pain, sneezing, sore throat, trouble swallowing and voice change.   Respiratory: Negative.   Cardiovascular: Negative.   Gastrointestinal: Negative.   Genitourinary: Negative.   Musculoskeletal: Negative.   Skin: Negative.   Neurological: Negative.   All other systems reviewed and are negative.   Physical Exam Updated Vital Signs BP 115/80   Pulse 65   Temp 98.3 F (36.8 C) (Oral)   Resp 18   Ht 5\' 8"  (1.727 m)   Wt 75 kg   SpO2 98%   BMI 25.14 kg/m   Physical Exam Vitals and nursing note reviewed.  Constitutional:      General: He is not in acute distress.    Appearance: He is well-developed. He is not ill-appearing, toxic-appearing or diaphoretic.  HENT:     Head: Normocephalic and atraumatic.     Right Ear: Tympanic membrane, ear canal and external ear normal. No drainage, swelling or tenderness. There is no impacted cerumen. No mastoid tenderness. No hemotympanum. Tympanic membrane is not scarred, perforated, erythematous, retracted or bulging.     Left Ear: External ear  normal. No drainage, swelling or tenderness. There is impacted cerumen. No mastoid tenderness.     Ears:     Comments: Cerumen impaction to left ear.  No tenderness over bilateral mastoid.  No drainage.    Nose: Nose normal.     Mouth/Throat:     Mouth: Mucous membranes are moist.  Eyes:     Pupils: Pupils are equal, round, and reactive to light.  Cardiovascular:     Rate and Rhythm: Normal rate and regular rhythm.     Pulses: Normal pulses.     Heart sounds: Normal heart sounds.   Pulmonary:     Effort: Pulmonary effort is normal. No respiratory distress.     Breath sounds: Normal breath sounds.  Abdominal:     General: Bowel sounds are normal. There is no distension.     Palpations: Abdomen is soft.  Musculoskeletal:        General: Normal range of motion.     Cervical back: Normal range of motion and neck supple. No rigidity or tenderness.  Lymphadenopathy:     Cervical: No cervical adenopathy.  Skin:    General: Skin is warm and dry.     Capillary Refill: Capillary refill takes less than 2 seconds.  Neurological:     General: No focal deficit present.     Mental Status: He is alert and oriented to person, place, and time.     ED Results / Procedures / Treatments   Labs (all labs ordered are listed, but only abnormal results are displayed) Labs Reviewed - No data to display  EKG None  Radiology No results found.  Procedures .Ear Cerumen Removal  Date/Time: 10/29/2020 11:48 AM Performed by: Linwood Dibbles, PA-C Authorized by: Linwood Dibbles, PA-C   Consent:    Consent obtained:  Verbal   Consent given by:  Patient   Risks, benefits, and alternatives were discussed: yes     Risks discussed:  Bleeding, infection, pain, TM perforation, incomplete removal and dizziness   Alternatives discussed:  No treatment, delayed treatment, alternative treatment, observation and referral Universal protocol:    Procedure explained and questions answered to patient or proxy's satisfaction: yes     Relevant documents present and verified: yes     Test results available: yes     Imaging studies available: yes     Required blood products, implants, devices, and special equipment available: yes     Site/side marked: yes     Immediately prior to procedure, a time out was called: yes     Patient identity confirmed:  Verbally with patient Procedure details:    Location:  L ear   Procedure type: irrigation     Procedure outcomes: cerumen removed    Post-procedure details:    Inspection:  No bleeding and ear canal clear (Perforated TM)   Hearing quality:  Improved   Procedure completion:  Tolerated well, no immediate complications     Medications Ordered in ED Medications  ciprofloxacin-dexamethasone (CIPRODEX) 0.3-0.1 % OTIC (EAR) suspension 4 drop (has no administration in time range)  docusate (COLACE) 50 MG/5ML liquid 50 mg (50 mg Left EAR Given 10/29/20 1044)    ED Course  I have reviewed the triage vital signs and the nursing notes.  Pertinent labs & imaging results that were available during my care of the patient were reviewed by me and considered in my medical decision making (see chart for details).  45 year old here for evaluation of fullness left ear.  He  is afebrile, nonseptic, non-ill-appearing.  Nonfocal neuro exam without deficits.  Right ear clear, WO otitis.  No tenderness over bilateral mastoids.  No nck stiffness or neck rigidity.  No meningismus.  Low suspicion for meningitis, dissection.  No drainage to bilateral ears.  Does have cerumen impaction to the left ear.  Will place Colace, have nursing irrigate and reassess.  Delay in meds from Pharmacy.  Reevaluation Left ear clear of cerumen after irrigation however does have mild TM perforation.  Given Ciprodex drops here in ED.  Discussed using at home.  He will follow-up with ENT.  The patient has been appropriately medically screened and/or stabilized in the ED. I have low suspicion for any other emergent medical condition which would require further screening, evaluation or treatment in the ED or require inpatient management.  Patient is hemodynamically stable and in no acute distress.  Patient able to ambulate in department prior to ED.  Evaluation does not show acute pathology that would require ongoing or additional emergent interventions while in the emergency department or further inpatient treatment.  I have discussed the diagnosis with the patient and  answered all questions.  Pain is been managed while in the emergency department and patient has no further complaints prior to discharge.  Patient is comfortable with plan discussed in room and is stable for discharge at this time.  I have discussed strict return precautions for returning to the emergency department.  Patient was encouraged to follow-up with PCP/specialist refer to at discharge.      MDM Rules/Calculators/A&P                          Final Clinical Impression(s) / ED Diagnoses Final diagnoses:  Impacted cerumen of left ear  Perforation of left tympanic membrane    Rx / DC Orders ED Discharge Orders    None       Dalissa Lovin A, PA-C 10/29/20 1150    Arby Barrette, MD 10/30/20 1135

## 2020-10-29 NOTE — ED Triage Notes (Signed)
Left ear fullness started a couple of hours ago. Pt states must have a lot of earwax.

## 2020-10-29 NOTE — Discharge Instructions (Addendum)
May use over the counter ear wax removers as needed.  Use the antibiotics given to him in the ED for 4 drops daily to your left ear.  Return for new or worsening symptoms or follow up with the ENT providers listed in your discharge paperwork.

## 2021-11-13 ENCOUNTER — Other Ambulatory Visit: Payer: Self-pay

## 2021-11-13 ENCOUNTER — Encounter (HOSPITAL_COMMUNITY): Payer: Self-pay | Admitting: Emergency Medicine

## 2021-11-13 ENCOUNTER — Emergency Department (HOSPITAL_COMMUNITY)
Admission: EM | Admit: 2021-11-13 | Discharge: 2021-11-14 | Disposition: A | Discharge status: Home / Self Care | Payer: 59 | Attending: Emergency Medicine | Admitting: Emergency Medicine

## 2021-11-13 DIAGNOSIS — R591 Generalized enlarged lymph nodes: Secondary | ICD-10-CM | POA: Diagnosis not present

## 2021-11-13 DIAGNOSIS — K0889 Other specified disorders of teeth and supporting structures: Secondary | ICD-10-CM | POA: Diagnosis present

## 2021-11-13 DIAGNOSIS — K029 Dental caries, unspecified: Secondary | ICD-10-CM | POA: Insufficient documentation

## 2021-11-13 LAB — GROUP A STREP BY PCR: Group A Strep by PCR: NOT DETECTED

## 2021-11-13 NOTE — ED Provider Triage Note (Signed)
Emergency Medicine Provider Triage Evaluation Note ? ?Paul Osborne , a 46 y.o. male  was evaluated in triage.  Pt complains of agile onset, constant, achy, right anterior cervical lymph node pain that began a couple of days ago.  Patient reports mild pain with swallowing, worsening pain with yawning, burping, talking loudly.  He does report that he has a cavity to a tooth in his right lower jaw.  He denies any facial swelling, difficulty swallowing, drooling, fevers, chills. No recent sick contacts. ? ?Review of Systems  ?Positive: + sore throat, lymphnode pain ?Negative: - fevers, chills ? ?Physical Exam  ?BP (!) 140/100   Pulse 76   Temp 98.1 ?F (36.7 ?C) (Oral)   Resp 16   SpO2 99%  ?Gen:   Awake, no distress   ?Resp:  Normal effort  ?MSK:   Moves extremities without difficulty  ?Other:  + anterior cervical lymphadenopathy on right side. Mild posterior oropharyngeal erythema and edema. No exudate appreciated. Phonating normally. No drooling.  ? ?Medical Decision Making  ?Medically screening exam initiated at 9:19 PM.  Appropriate orders placed.  Paul Osborne was informed that the remainder of the evaluation will be completed by another provider, this initial triage assessment does not replace that evaluation, and the importance of remaining in the ED until their evaluation is complete. ? ?Question strep vs dental infection causing lymphadenopathy ?  ?Tanda Rockers, PA-C ?11/13/21 2121 ? ?

## 2021-11-13 NOTE — ED Triage Notes (Signed)
Pt reported to ED with c/o "swollen lymph node" to rt lower jaw/neck.  Sttes that he does have dental problems to affected area that he has not been evaluated  by dentist for. Pt endorses difficulty swallowing and "raising his voice". States swelling has been present since approximately Thursday.  ?

## 2021-11-14 MED ORDER — PENICILLIN V POTASSIUM 500 MG PO TABS: 500.0000 mg | ORAL_TABLET | Freq: Four times a day (QID) | ORAL | 0 refills | Status: AC

## 2021-11-14 MED ORDER — PENICILLIN V POTASSIUM 250 MG PO TABS
500.0000 mg | ORAL_TABLET | Freq: Once | ORAL | Status: AC
  Administered 2021-11-14: 500 mg via ORAL
  Filled 2021-11-14: qty 2

## 2021-11-14 NOTE — ED Provider Notes (Signed)
?  [facility]-EMERGENCY DEPT ?[facility] Emergency Department ?Provider Note ?MRN:  [id number]  ?Arrival date & time: 11/14/21    ? ?Chief Complaint   ?No chief complaint on file. ?  ?History of Present Illness   ?Paul Osborne is a 46 y.o. year-old male presents to the ED with chief complaint of dental pain and swollen submandibular lymph node.  Onset was a few days ago.  No treatments PTA. ? ?History provided by patient. ? ? ?Review of Systems  ?Pertinent review of systems noted in HPI.  ? ? ?Physical Exam  ? ?Vitals:  ? 11/13/21 2040  ?BP: (!) 140/100  ?Pulse: 76  ?Resp: 16  ?Temp: 98.1 ?F (36.7 ?C)  ?SpO2: 99%  ?  ?CONSTITUTIONAL:  well-appearing, NAD ?NEURO:  Alert and oriented x 3, CN 3-12 grossly intact ?EYES:  eyes equal and reactive ?ENT/NECK:  Supple, no stridor, dental caries, no abscess, submandibular node on right ?CARDIO:  appears well-perfused  ?PULM:  No respiratory distress,  ?GI/GU:  non-distended,  ?MSK/SPINE:  No gross deformities, no edema, moves all extremities  ?SKIN:  no rash, atraumatic ? ? ?*Additional and/or pertinent findings included in MDM below ? ?Diagnostic and Interventional Summary  ? ? ?Labs Reviewed  ?GROUP A STREP BY PCR  ?  ?No orders to display  ?  ?Medications  ?penicillin v potassium (VEETID) tablet 500 mg (has no administration in time range)  ?  ? ?Procedures  /  Critical Care ?Procedures ? ?ED Course and Medical Decision Making  ?I have reviewed the triage vital signs, the nursing notes, and pertinent available records from the EMR. ? ?Complexity of Problems Addressed: ?Low Complexity: Acute, uncomplicated illness or injury requiring no diagnostic workup ?Comorbidities affecting this illness/injury include: ?None ?Social Determinants Affecting Care: ?Complexity of care is increased due to access to medical care. ? ? ?ED Course: ?After considering the following differential, dental pain and swollen lymph node, I ordered penicillin. ?. ? ?  ? ?Consultants: ?No  consultations were needed in caring for this patient. ? ?Treatment and Plan: ?Patient with dentalgia with reactive lympadenopathy.  No abscess requiring immediate incision and drainage.  Exam not concerning for Ludwig's angina or pharyngeal abscess.  Will treat with penicillin. Pt instructed to follow-up with dentist.  Discussed return precautions. Pt safe for discharge. ? ? ?Emergency department workup does not suggest an emergent condition requiring admission or immediate intervention beyond  what has been performed at this time. The patient is safe for discharge and has  been instructed to return immediately for worsening symptoms, change in  symptoms or any other concerns ? ? ? ?Final Clinical Impressions(s) / ED Diagnoses  ? ?  ICD-10-CM   ?1. Pain due to dental caries  K02.9   ?  ?2. Lymphadenopathy  R59.1   ?  ?  ?ED Discharge Orders   ? ?      Ordered  ?  penicillin v potassium (VEETID) 500 MG tablet  4 times daily       ? 11/14/21 0047  ? ?  ?  ? ?  ?  ? ? ?Discharge Instructions Discussed with and Provided to Patient:  ? ?Discharge Instructions   ?None ?  ? ?  ?Roxy Horseman, PA-C ?11/14/21 7408 ? ?  ?Dione Booze, MD ?11/14/21 [phone] ? ?

## 2022-01-17 ENCOUNTER — Encounter (HOSPITAL_COMMUNITY): Payer: Self-pay

## 2022-01-17 ENCOUNTER — Ambulatory Visit (HOSPITAL_COMMUNITY)
Admission: EM | Admit: 2022-01-17 | Discharge: 2022-01-17 | Disposition: A | Discharge status: Home / Self Care | Payer: 59 | Attending: Family Medicine | Admitting: Family Medicine

## 2022-01-17 DIAGNOSIS — L03011 Cellulitis of right finger: Secondary | ICD-10-CM | POA: Diagnosis not present

## 2022-01-17 MED ORDER — IBUPROFEN 800 MG PO TABS: 800.0000 mg | ORAL_TABLET | Freq: Three times a day (TID) | ORAL | 0 refills | Status: DC | PRN

## 2022-01-17 MED ORDER — AMOXICILLIN-POT CLAVULANATE 875-125 MG PO TABS: 1.0000 | ORAL_TABLET | Freq: Two times a day (BID) | ORAL | 0 refills | Status: DC

## 2022-01-17 NOTE — ED Provider Notes (Signed)
[facility]    CSN: [id number] Arrival date & time: 01/17/22  1559      History   Chief Complaint Chief Complaint  Patient presents with   Nail Problem   Skin Tag    Under right arm.     HPI Paul Osborne is a 46 y.o. male.   HPI Here with swelling in tenderness around his right ring finger nail.  About 5 days ago he peeled a hangnail off there and then it started swelling and is tender.  No drainage from the area  Past Medical History:  Diagnosis Date   Hypertension    Pt denies    Patient Active Problem List   Diagnosis Date Noted   Penile cyst 05/19/2015    History reviewed. No pertinent surgical history.     Home Medications    Prior to Admission medications   Medication Sig Start Date End Date Taking? Authorizing Provider  amoxicillin-clavulanate (AUGMENTIN) 875-125 MG tablet Take 1 tablet by mouth 2 (two) times daily for 7 days. 01/17/22 01/24/22 Yes Cruise Baumgardner, Janace Aris, MD  ibuprofen (ADVIL) 800 MG tablet Take 1 tablet (800 mg total) by mouth every 8 (eight) hours as needed (pain). 01/17/22  Yes Zenia Resides, MD    Family History History reviewed. No pertinent family history.  Social History Social History   Tobacco Use   Smoking status: Every Day    Packs/day: 0.50    Types: Cigarettes   Smokeless tobacco: Never  Substance Use Topics   Alcohol use: Yes    Comment: Socially    Drug use: Yes    Types: Marijuana     Allergies   Patient has no known allergies.   Review of Systems Review of Systems   Physical Exam Triage Vital Signs ED Triage Vitals  Enc Vitals Group     BP 01/17/22 1629 132/90     Pulse Rate 01/17/22 1629 71     Resp 01/17/22 1629 16     Temp 01/17/22 1629 98.8 F (37.1 C)     Temp Source 01/17/22 1629 Oral     SpO2 01/17/22 1629 100 %     Weight 01/17/22 1631 170 lb (77.1 kg)     Height --      Head Circumference --      Peak Flow --      Pain Score 01/17/22 1631 8     Pain Loc --       Pain Edu? --      Excl. in GC? --    No data found.  Updated Vital Signs BP 132/90 (BP Location: Left Arm)   Pulse 71   Temp 98.8 F (37.1 C) (Oral)   Resp 16   Wt 77.1 kg   SpO2 100%   BMI 25.85 kg/m   Visual Acuity Right Eye Distance:   Left Eye Distance:   Bilateral Distance:    Right Eye Near:   Left Eye Near:    Bilateral Near:     Physical Exam Vitals reviewed.  Constitutional:      General: He is not in acute distress.    Appearance: He is not ill-appearing, toxic-appearing or diaphoretic.  Musculoskeletal:     Comments: There is mild edema of the proximal and medial nail folds of the right ring finger nail.  There is no fluctuance and no pointing area.  No discharge  Skin:    Coloration: Skin is not pale.  Neurological:  Mental Status: He is oriented to person, place, and time.  Psychiatric:        Behavior: Behavior normal.     UC Treatments / Results  Labs (all labs ordered are listed, but only abnormal results are displayed) Labs Reviewed - No data to display  EKG   Radiology No results found.  Procedures Procedures (including critical care time)  Medications Ordered in UC Medications - No data to display  Initial Impression / Assessment and Plan / UC Course  I have reviewed the triage vital signs and the nursing notes.  Pertinent labs & imaging results that were available during my care of the patient were reviewed by me and considered in my medical decision making (see chart for details).     We will treat the cellulitis of his finger with Augmentin and ibuprofen as needed.  At present it is not a paronychia and does not need drainage Final Clinical Impressions(s) / UC Diagnoses   Final diagnoses:  Cellulitis of right ring finger     Discharge Instructions      Take amoxicillin-clavulanate 875 mg--1 tab twice daily with food for 7 days  Take ibuprofen 800 mg--1 tab every 8 hours as needed for pain.  Do warm soaks or  compresses to your finger.     ED Prescriptions     Medication Sig Dispense Auth. Provider   amoxicillin-clavulanate (AUGMENTIN) 875-125 MG tablet Take 1 tablet by mouth 2 (two) times daily for 7 days. 14 tablet Jevon Shells, Janace Aris, MD   ibuprofen (ADVIL) 800 MG tablet Take 1 tablet (800 mg total) by mouth every 8 (eight) hours as needed (pain). 21 tablet Lyniah Fujita, Janace Aris, MD      PDMP not reviewed this encounter.   Zenia Resides, MD 01/17/22 [phone]

## 2022-01-17 NOTE — Discharge Instructions (Addendum)
Take amoxicillin-clavulanate 875 mg--1 tab twice daily with food for 7 days  Take ibuprofen 800 mg--1 tab every 8 hours as needed for pain.  Do warm soaks or compresses to your finger.

## 2022-01-17 NOTE — ED Triage Notes (Signed)
Possible infection in the cuticle of the right hand ring finger. Pulled a piece of the cuticle off 4-5 days ago.  Swelling and painful.

## 2022-01-19 ENCOUNTER — Encounter (HOSPITAL_COMMUNITY): Payer: Self-pay | Admitting: Emergency Medicine

## 2022-01-19 ENCOUNTER — Ambulatory Visit (HOSPITAL_COMMUNITY)
Admission: EM | Admit: 2022-01-19 | Discharge: 2022-01-19 | Disposition: A | Discharge status: Home / Self Care | Payer: 59 | Attending: Student | Admitting: Student

## 2022-01-19 DIAGNOSIS — L03011 Cellulitis of right finger: Secondary | ICD-10-CM

## 2022-01-19 MED ORDER — DOXYCYCLINE HYCLATE 100 MG PO CAPS: 100.0000 mg | ORAL_CAPSULE | Freq: Two times a day (BID) | ORAL | 0 refills | Status: AC

## 2022-01-19 NOTE — ED Triage Notes (Signed)
Pt was seen here 2 days ago for the infection around right 4th finger and not any better.

## 2022-01-19 NOTE — ED Provider Notes (Addendum)
[facility]    CSN: [id number] Arrival date & time: 01/19/22  1814      History   Chief Complaint Chief Complaint  Patient presents with   Follow-up    HPI Paul Osborne is a 46 y.o. male presenting for follow-up of right ring finger paronychia.  We last saw him on 01/17/2022 for this, Augmentin was prescribed.  Patient states that it has only gotten worse since then.  He has been doing warm soaks 4 times daily.  He states that he is here today because he needs relief today.  HPI  Past Medical History:  Diagnosis Date   Hypertension    Pt denies    Patient Active Problem List   Diagnosis Date Noted   Penile cyst 05/19/2015    History reviewed. No pertinent surgical history.     Home Medications    Prior to Admission medications   Medication Sig Start Date End Date Taking? Authorizing Provider  doxycycline (VIBRAMYCIN) 100 MG capsule Take 1 capsule (100 mg total) by mouth 2 (two) times daily for 7 days. 01/19/22 01/26/22 Yes Rhys Martini, PA-C  ibuprofen (ADVIL) 800 MG tablet Take 1 tablet (800 mg total) by mouth every 8 (eight) hours as needed (pain). 01/17/22   Zenia Resides, MD    Family History No family history on file.  Social History Social History   Tobacco Use   Smoking status: Every Day    Packs/day: 0.50    Types: Cigarettes   Smokeless tobacco: Never  Substance Use Topics   Alcohol use: Yes    Comment: Socially    Drug use: Yes    Types: Marijuana     Allergies   Patient has no known allergies.   Review of Systems Review of Systems  Skin:  Positive for wound.  All other systems reviewed and are negative.    Physical Exam Triage Vital Signs ED Triage Vitals [01/19/22 1829]  Enc Vitals Group     BP (!) 152/109     Pulse Rate 66     Resp 17     Temp 98.7 F (37.1 C)     Temp Source Oral     SpO2 98 %     Weight      Height      Head Circumference      Peak Flow      Pain Score 8     Pain Loc      Pain  Edu?      Excl. in GC?    No data found.  Updated Vital Signs BP (!) 152/109 (BP Location: Left Arm)   Pulse 66   Temp 98.7 F (37.1 C) (Oral)   Resp 17   SpO2 98%   Visual Acuity Right Eye Distance:   Left Eye Distance:   Bilateral Distance:    Right Eye Near:   Left Eye Near:    Bilateral Near:     Physical Exam Vitals reviewed.  Constitutional:      General: He is not in acute distress.    Appearance: Normal appearance. He is not ill-appearing.  HENT:     Head: Normocephalic and atraumatic.  Pulmonary:     Effort: Pulmonary effort is normal.  Skin:    Comments: See image below R ring finger with swelling over the medial and proximal nail folds. There is minimal fluctuance. Swelling does not extend to PIP; there is no felon. Cap refill <2 seconds.  Neurological:     General: No focal deficit present.     Mental Status: He is alert and oriented to person, place, and time.  Psychiatric:        Mood and Affect: Mood normal.        Behavior: Behavior normal.        Thought Content: Thought content normal.        Judgment: Judgment normal.        UC Treatments / Results  Labs (all labs ordered are listed, but only abnormal results are displayed) Labs Reviewed - No data to display  EKG   Radiology No results found.  Procedures Incision and Drainage  Date/Time: 01/19/2022 6:54 PM  Performed by: Hazel Sams, PA-C Authorized by: Hazel Sams, PA-C   Consent:    Consent obtained:  Verbal   Consent given by:  Patient   Risks, benefits, and alternatives were discussed: yes     Alternatives discussed:  No treatment Universal protocol:    Procedure explained and questions answered to patient or proxy's satisfaction: yes     Immediately prior to procedure, a time out was called: yes     Patient identity confirmed:  Verbally with patient and arm band Location:    Type:  Abscess   Location:  Upper extremity   Upper extremity location:  Finger    Finger location:  R ring finger Pre-procedure details:    Skin preparation:  Antiseptic wash Anesthesia:    Anesthesia method:  Topical application Procedure details:    Incision types:  Stab incision   Drainage:  Purulent   Drainage amount:  Scant Post-procedure details:    Procedure completion:  Tolerated well, no immediate complications  (including critical care time)  Medications Ordered in UC Medications - No data to display  Initial Impression / Assessment and Plan / UC Course  I have reviewed the triage vital signs and the nursing notes.  Pertinent labs & imaging results that were available during my care of the patient were reviewed by me and considered in my medical decision making (see chart for details).     This patient is a very pleasant 46 y.o. year old male presenting with R ring finger paronychia. Neurovascularly intact. Has completed two days of augmentin without relief. The paronychia is immature with minimal fluctuance. He is adamant that we attempt I&D today. Attempted as above with minimal drainage. Stop augmentin, start doxycycline. Continue warm soaks. Work note provided. ED return precautions discussed. Patient verbalizes understanding and agreement.   Final Clinical Impressions(s) / UC Diagnoses   Final diagnoses:  Paronychia of right ring finger     Discharge Instructions      -stop augmentin and start. Doxycycline twice daily for 7 days.  Make sure to wear sunscreen while spending time outside while on this medication as it can increase your chance of sunburn. You can take this medication with food if you have a sensitive stomach. -Continue soaking in warm water -Head to the ED if symptoms worsen instead of improve     ED Prescriptions     Medication Sig Dispense Auth. Provider   doxycycline (VIBRAMYCIN) 100 MG capsule Take 1 capsule (100 mg total) by mouth 2 (two) times daily for 7 days. 14 capsule Hazel Sams, PA-C      PDMP not  reviewed this encounter.   Hazel Sams, PA-C 01/19/22 1856    Hazel Sams, PA-C 01/19/22 1857

## 2022-01-19 NOTE — Discharge Instructions (Addendum)
-  stop augmentin and start. Doxycycline twice daily for 7 days.  Make sure to wear sunscreen while spending time outside while on this medication as it can increase your chance of sunburn. You can take this medication with food if you have a sensitive stomach. -Continue soaking in warm water -Head to the ED if symptoms worsen instead of improve

## 2022-01-21 ENCOUNTER — Ambulatory Visit (HOSPITAL_COMMUNITY)
Admission: EM | Admit: 2022-01-21 | Discharge: 2022-01-21 | Disposition: A | Discharge status: Home / Self Care | Payer: 59 | Attending: Physician Assistant | Admitting: Physician Assistant

## 2022-01-21 ENCOUNTER — Encounter (HOSPITAL_COMMUNITY): Payer: Self-pay | Admitting: Emergency Medicine

## 2022-01-21 DIAGNOSIS — L03011 Cellulitis of right finger: Secondary | ICD-10-CM | POA: Insufficient documentation

## 2022-01-21 DIAGNOSIS — M79644 Pain in right finger(s): Secondary | ICD-10-CM | POA: Diagnosis present

## 2022-01-21 MED ORDER — SULFAMETHOXAZOLE-TRIMETHOPRIM 800-160 MG PO TABS: 1.0000 | ORAL_TABLET | Freq: Two times a day (BID) | ORAL | 0 refills | Status: AC

## 2022-01-21 MED ORDER — HYDROCODONE-ACETAMINOPHEN 5-325 MG PO TABS: 1.0000 | ORAL_TABLET | Freq: Four times a day (QID) | ORAL | 0 refills | Status: DC | PRN

## 2022-01-21 NOTE — Discharge Instructions (Signed)
Completed.Asked patient to switch the antibiotic to Bactrim DS 1 every 12 hours Advised to continue the Epsom salt soaks on a frequent basis 3-4 times throughout the day if possible. Advised to take the Vicodin tablets 1-2 every 6-8 hours as needed for pain, can take ibuprofen when the Vicodin is not needed. Advised to follow-up with PCP or return to urgent care symptoms fail to improve.

## 2022-01-21 NOTE — ED Provider Notes (Signed)
[facility]    CSN: [id number] Arrival date & time: 01/21/22  1446      History   Chief Complaint Chief Complaint  Patient presents with   finger swelling    HPI Paul Osborne is a 46 y.o. male.   46 year old presents with a paronychia of the right fourth digit.  Patient relates he continues to have right finger pain, swelling, and redness.  Patient relates that he is continuing to do Epsom salt soaks and also take the doxycycline on a regular basis but this has not improved his symptoms.  Patient relates he is having severe pain and discomfort from the infection in the finger, he describes the pain as being 10 on a scale of 1-10.  Patient relates that ibuprofen just does not control his pain.  Patient relates he is not having any fever or chills, he is able to move his right hand and all digits normally.  Patient decays he just has a lot of pain when anything hits that fourth digit.     Past Medical History:  Diagnosis Date   Hypertension    Pt denies    Patient Active Problem List   Diagnosis Date Noted   Penile cyst 05/19/2015    History reviewed. No pertinent surgical history.     Home Medications    Prior to Admission medications   Medication Sig Start Date End Date Taking? Authorizing Provider  HYDROcodone-acetaminophen (NORCO/VICODIN) 5-325 MG tablet Take 1-2 tablets by mouth every 6 (six) hours as needed. 01/21/22  Yes Ellsworth Lennox, PA-C  sulfamethoxazole-trimethoprim (BACTRIM DS) 800-160 MG tablet Take 1 tablet by mouth 2 (two) times daily for 7 days. 01/21/22 01/28/22 Yes Ellsworth Lennox, PA-C  doxycycline (VIBRAMYCIN) 100 MG capsule Take 1 capsule (100 mg total) by mouth 2 (two) times daily for 7 days. 01/19/22 01/26/22  Rhys Martini, PA-C  ibuprofen (ADVIL) 800 MG tablet Take 1 tablet (800 mg total) by mouth every 8 (eight) hours as needed (pain). 01/17/22   Zenia Resides, MD    Family History History reviewed. No pertinent family  history.  Social History Social History   Tobacco Use   Smoking status: Every Day    Packs/day: 0.50    Types: Cigarettes   Smokeless tobacco: Never  Substance Use Topics   Alcohol use: Yes    Comment: Socially    Drug use: Yes    Types: Marijuana     Allergies   Patient has no known allergies.   Review of Systems Review of Systems  Skin:  Positive for wound (right 4th digit paronychia).     Physical Exam Triage Vital Signs ED Triage Vitals [01/21/22 1503]  Enc Vitals Group     BP (!) 143/99     Pulse Rate (!) 103     Resp 16     Temp 98.4 F (36.9 C)     Temp Source Oral     SpO2 98 %     Weight      Height      Head Circumference      Peak Flow      Pain Score 10     Pain Loc      Pain Edu?      Excl. in GC?    No data found.  Updated Vital Signs BP (!) 143/99 (BP Location: Left Arm)   Pulse (!) 103   Temp 98.4 F (36.9 C) (Oral)   Resp 16   SpO2  98%   Visual Acuity Right Eye Distance:   Left Eye Distance:   Bilateral Distance:    Right Eye Near:   Left Eye Near:    Bilateral Near:     Physical Exam Constitutional:      Appearance: Normal appearance.  Skin:    Comments: Right hand fourth digit: At the tuft and the inner nail margin there is mild redness and swelling, range of motion is normal.  There is a paronychia which is present at the right fourth digit inner aspect of the nail margin. Procedure: Patient verbally agrees to have an I&D of the area the area is cleaned with Hibiclens, the area is incised underneath the nail margin, purulent material is expressed, and wound culture is taken.  No complications.  Capillary refill normal.  Neurological:     Mental Status: He is alert.      UC Treatments / Results  Labs (all labs ordered are listed, but only abnormal results are displayed) Labs Reviewed  AEROBIC CULTURE W GRAM STAIN (SUPERFICIAL SPECIMEN)    EKG   Radiology No results found.  Procedures Procedures (including  critical care time)  Medications Ordered in UC Medications - No data to display  Initial Impression / Assessment and Plan / UC Course  I have reviewed the triage vital signs and the nursing notes.  Pertinent labs & imaging results that were available during my care of the patient were reviewed by me and considered in my medical decision making (see chart for details).    Plan: 1.  Advised to continue Epsom salt soaks frequently throughout the day to help decrease the swelling and pain. 2.  The antibiotic has been changed from doxycycline to Bactrim DS 1 every 12 hours until completed. 3.  Advised the patient to take the Vicodin tablets 1-2 every 6-8 hours as needed for pain, can take Advil in between when the pain medicine is not needed. 4.  Advised to follow-up with PCP or return to urgent care if symptoms fail to improve. 5.  Wound culture of the paronychia has been taken and is pending. Final Clinical Impressions(s) / UC Diagnoses   Final diagnoses:  Pain of finger of right hand  Paronychia of finger, right     Discharge Instructions      Completed.Asked patient to switch the antibiotic to Bactrim DS 1 every 12 hours Advised to continue the Epsom salt soaks on a frequent basis 3-4 times throughout the day if possible. Advised to take the Vicodin tablets 1-2 every 6-8 hours as needed for pain, can take ibuprofen when the Vicodin is not needed. Advised to follow-up with PCP or return to urgent care symptoms fail to improve.    ED Prescriptions     Medication Sig Dispense Auth. Provider   sulfamethoxazole-trimethoprim (BACTRIM DS) 800-160 MG tablet Take 1 tablet by mouth 2 (two) times daily for 7 days. 14 tablet Ellsworth Lennox, PA-C   HYDROcodone-acetaminophen (NORCO/VICODIN) 5-325 MG tablet Take 1-2 tablets by mouth every 6 (six) hours as needed. 24 tablet Ellsworth Lennox, PA-C      I have reviewed the PDMP during this encounter.   Ellsworth Lennox, PA-C 01/21/22 [phone]

## 2022-01-21 NOTE — ED Triage Notes (Signed)
Finger swelling, seen here recently for same, has been taking antibiotics, swelling continues to worsen. Reports 10/10 pain

## 2022-01-22 LAB — AEROBIC CULTURE W GRAM STAIN (SUPERFICIAL SPECIMEN)

## 2022-01-24 LAB — AEROBIC CULTURE W GRAM STAIN (SUPERFICIAL SPECIMEN)

## 2022-01-25 LAB — AEROBIC CULTURE W GRAM STAIN (SUPERFICIAL SPECIMEN)

## 2022-02-23 ENCOUNTER — Encounter (HOSPITAL_COMMUNITY): Payer: Self-pay

## 2022-02-23 ENCOUNTER — Other Ambulatory Visit: Payer: Self-pay

## 2022-02-23 ENCOUNTER — Emergency Department (HOSPITAL_COMMUNITY)
Admission: EM | Admit: 2022-02-23 | Discharge: 2022-02-23 | Disposition: A | Discharge status: Home / Self Care | Payer: 59 | Attending: Emergency Medicine | Admitting: Emergency Medicine

## 2022-02-23 ENCOUNTER — Emergency Department (HOSPITAL_COMMUNITY): Payer: 59

## 2022-02-23 DIAGNOSIS — M79644 Pain in right finger(s): Secondary | ICD-10-CM | POA: Insufficient documentation

## 2022-02-23 DIAGNOSIS — Y92410 Unspecified street and highway as the place of occurrence of the external cause: Secondary | ICD-10-CM | POA: Diagnosis not present

## 2022-02-23 DIAGNOSIS — S0181XA Laceration without foreign body of other part of head, initial encounter: Secondary | ICD-10-CM

## 2022-02-23 DIAGNOSIS — Z23 Encounter for immunization: Secondary | ICD-10-CM | POA: Diagnosis not present

## 2022-02-23 DIAGNOSIS — S01111A Laceration without foreign body of right eyelid and periocular area, initial encounter: Secondary | ICD-10-CM | POA: Diagnosis present

## 2022-02-23 MED ORDER — CYCLOBENZAPRINE HCL 10 MG PO TABS: 10.0000 mg | ORAL_TABLET | Freq: Two times a day (BID) | ORAL | 0 refills | Status: DC | PRN

## 2022-02-23 MED ORDER — TETANUS-DIPHTH-ACELL PERTUSSIS 5-2.5-18.5 LF-MCG/0.5 IM SUSY
0.5000 mL | PREFILLED_SYRINGE | Freq: Once | INTRAMUSCULAR | Status: AC
  Administered 2022-02-23: 0.5 mL via INTRAMUSCULAR
  Filled 2022-02-23: qty 0.5

## 2022-02-23 NOTE — ED Notes (Signed)
Patient now reporting upper back pain.

## 2022-02-23 NOTE — Discharge Instructions (Addendum)
Recommend Tylenol, ibuprofen and ice for what I suspect is a right hand/thumb sprain.  If you are having persistent pain in your thumb lasting longer than 10 days recommend following up with hand doctor for may be repeat imaging and repeat evaluation.
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
## 2023-02-19 ENCOUNTER — Emergency Department (HOSPITAL_COMMUNITY)
Admission: EM | Admit: 2023-02-19 | Discharge: 2023-02-20 | Discharge status: Against Medical Advice | Payer: 59 | Attending: Emergency Medicine | Admitting: Emergency Medicine

## 2023-02-19 ENCOUNTER — Encounter (HOSPITAL_COMMUNITY): Payer: Self-pay

## 2023-02-19 ENCOUNTER — Other Ambulatory Visit: Payer: Self-pay

## 2023-02-19 DIAGNOSIS — H5789 Other specified disorders of eye and adnexa: Secondary | ICD-10-CM | POA: Diagnosis not present

## 2023-02-19 DIAGNOSIS — Z5321 Procedure and treatment not carried out due to patient leaving prior to being seen by health care provider: Secondary | ICD-10-CM | POA: Insufficient documentation

## 2023-02-19 MED ORDER — FLUORESCEIN SODIUM 1 MG OP STRP: 1.0000 | ORAL_STRIP | Freq: Once | OPHTHALMIC | Status: DC

## 2023-02-19 MED ORDER — TETRACAINE HCL 0.5 % OP SOLN: 2.0000 [drp] | Freq: Once | OPHTHALMIC | Status: DC

## 2023-02-19 NOTE — ED Triage Notes (Signed)
Pt complains of BIL styes in his upper eyelids. Pt states that L stye has been there about 30 days and the R stye has been there about a week. Pt states that these have been a recurrent problem and the only thing that helps is doxycycline.

## 2023-02-20 ENCOUNTER — Ambulatory Visit (HOSPITAL_COMMUNITY)
Admission: EM | Admit: 2023-02-20 | Discharge: 2023-02-20 | Disposition: A | Discharge status: Home / Self Care | Payer: 59 | Attending: Emergency Medicine | Admitting: Emergency Medicine

## 2023-02-20 ENCOUNTER — Other Ambulatory Visit: Payer: Self-pay

## 2023-02-20 ENCOUNTER — Encounter (HOSPITAL_COMMUNITY): Payer: Self-pay | Admitting: *Deleted

## 2023-02-20 DIAGNOSIS — H00011 Hordeolum externum right upper eyelid: Secondary | ICD-10-CM

## 2023-02-20 DIAGNOSIS — H0014 Chalazion left upper eyelid: Secondary | ICD-10-CM

## 2023-02-20 MED ORDER — KETOCONAZOLE 2 % EX CREA: TOPICAL_CREAM | Freq: Two times a day (BID) | CUTANEOUS | 1 refills | Status: AC

## 2023-02-20 MED ORDER — TRIAMCINOLONE ACETONIDE 0.025 % EX CREA: 1.0000 | TOPICAL_CREAM | Freq: Two times a day (BID) | CUTANEOUS | 1 refills | Status: AC

## 2023-02-20 MED ORDER — DOXYCYCLINE HYCLATE 100 MG PO CAPS: 100.0000 mg | ORAL_CAPSULE | Freq: Two times a day (BID) | ORAL | 2 refills | Status: AC

## 2023-02-20 NOTE — Discharge Instructions (Addendum)
For treatment of your recurrent hordeolum which is likely infected at this time, I recommend you begin doxycycline.  I sent a prescription to your pharmacy, please take 1 tablet twice daily until your prescription is complete or until you are seen by ophthalmologist and advised to do otherwise.  I believe that your frequent styes could possibly be due to rosacea or seborrheic dermatitis which are both inflammatory conditions.  Doxycycline is often used to treat these conditions which is the most likely reason why doxycycline worked so well the last time you were given this medication.  For treatment of seborrheic dermatitis, I recommend that you begin topical treatment with 2 medications as well, ketoconazole cream and triamcinolone cream.  Ketoconazole is an antifungal cream and triamcinolone is a steroid cream.  I would like for you to mix these 2 medications together and apply a very, very small amount to both eyelids twice daily, again either until your prescription runs out or until you are advised otherwise by an ophthalmologist and advised to do otherwise.  Doxycycline will address possible rosacea.  Please use the QR code at the end of this checkout summary to go to the appointment scheduling website for current health.  There you will be able to find names, contact information and schedule appointment with any [facility] provider; ophthalmology, dermatology, plastic surgery, primary care, etc.   Thank you for visiting [facility] today.  We appreciate the opportunity to participate in your care.

## 2023-02-20 NOTE — ED Provider Notes (Signed)
[facility]    CSN: [id number] Arrival date & time: 02/20/23  1450    HISTORY   Chief Complaint  Patient presents with   Eye Problem   HPI Paul Osborne is a pleasant, 47 y.o. male who presents to urgent care today. Patient complains of having a stye on each eyelid.  Patient states that his right eye is having excessive tears which sometimes appear thick, left eye is draining clear tears, both eyes are burning.  Patient complains of his right upper eyelid being swollen.  Patient states he has had this problem for many years and has recurrent styes every several months.  Patient states he was seen at the ED in the past and given a prescription for doxycycline after having failed 6 other prescriptions for antibiotics, states doxycycline is the only antibiotic that resolves his styes.  EMR reviewed, patient has been advised to follow-up with ophthalmology and dermatology in the past which he states he has been able to do due to intermittent issues with insurance and work.  Patient denies vision changes, frank eye pain, redness, swelling or pain of lower eyelids.  Patient states previous styes have always been in the same places on his left and right upper eyelids.  Patient states the right upper eye lid stye is the one that gets infected whereas the left one never does.  Patient states he has been attempting to apply warm compresses when he is able however due to work this is not possible, works as a Designer, fashion/clothing.  Patient states he has not tried any other topical medications to alleviate this issue.  Patient states he has never been advised to clean his eyes with baby shampoo.  The history is provided by the patient.   Past Medical History:  Diagnosis Date   Hypertension    Pt denies   Patient Active Problem List   Diagnosis Date Noted   Penile cyst 05/19/2015   History reviewed. No pertinent surgical history.  Home Medications    Prior to Admission medications    Medication Sig Start Date End Date Taking? Authorizing Provider  doxycycline (VIBRAMYCIN) 100 MG capsule Take 1 capsule (100 mg total) by mouth 2 (two) times daily. 02/20/23 05/21/23 Yes Theadora Rama Scales, PA-C  ketoconazole (NIZORAL) 2 % cream Apply topically 2 (two) times daily. 02/20/23 03/22/23 Yes Theadora Rama Scales, PA-C  triamcinolone (KENALOG) 0.025 % cream Apply 1 Application topically 2 (two) times daily. Apply to affected area(s) on face twice daily 02/20/23 03/22/23 Yes Theadora Rama Scales, PA-C    Family History History reviewed. No pertinent family history. Social History Social History   Tobacco Use   Smoking status: Every Day    Packs/day: .5    Types: Cigarettes   Smokeless tobacco: Never  Substance Use Topics   Alcohol use: Yes    Comment: Socially    Drug use: Yes    Types: Marijuana   Allergies   Patient has no known allergies.  Review of Systems Review of Systems Pertinent findings revealed after performing a 14 point review of systems has been noted in the history of present illness.  Physical Exam Vital Signs BP (!) 135/94   Pulse 67   Temp 98.1 F (36.7 C)   Resp 16   SpO2 98%   No data found.  Physical Exam Vitals and nursing note reviewed.  Constitutional:      General: He is not in acute distress.    Appearance: Normal appearance. He is  normal weight. He is not ill-appearing.  HENT:     Head: Normocephalic and atraumatic.  Eyes:     General: Lids are everted, no foreign bodies appreciated. Vision grossly intact. No allergic shiner, visual field deficit or scleral icterus.       Right eye: Discharge present. No foreign body.        Left eye: No foreign body or discharge.     Extraocular Movements: Extraocular movements intact.     Conjunctiva/sclera: Conjunctivae normal.     Right eye: Right conjunctiva is not injected. No chemosis or exudate.    Left eye: Left conjunctiva is not injected. No chemosis or exudate.    Pupils: Pupils  are equal, round, and reactive to light.   Cardiovascular:     Rate and Rhythm: Normal rate and regular rhythm.  Pulmonary:     Effort: Pulmonary effort is normal.     Breath sounds: Normal breath sounds.  Musculoskeletal:        General: Normal range of motion.     Cervical back: Normal range of motion and neck supple.  Skin:    General: Skin is warm and dry.  Neurological:     General: No focal deficit present.     Mental Status: He is alert and oriented to person, place, and time. Mental status is at baseline.  Psychiatric:        Mood and Affect: Mood normal.        Behavior: Behavior normal.        Thought Content: Thought content normal.        Judgment: Judgment normal.     Visual Acuity Right Eye Distance:   Left Eye Distance:   Bilateral Distance:    Right Eye Near:   Left Eye Near:    Bilateral Near:     UC Couse / Diagnostics / Procedures:     Radiology No results found.  Procedures Procedures (including critical care time) EKG  Pending results:  Labs Reviewed - No data to display  Medications Ordered in UC: Medications - No data to display  UC Diagnoses / Final Clinical Impressions(s)   I have reviewed the triage vital signs and the nursing notes.  Pertinent labs & imaging results that were available during my care of the patient were reviewed by me and considered in my medical decision making (see chart for details).    Final diagnoses:  Chalazion left upper eyelid  Hordeolum externum of right upper eyelid   Patient advised that his frequently recurrent styes may be related to a chronic skin condition such as rosacea or seborrheic dermatitis which is possibly why doxycycline does help relieve his symptoms.  Patient again advised, he should establish care with an ophthalmologist for regular follow-up of frequent occurrence of styes and establish care with a dermatologist for evaluation and treatment for possible rosacea and/or seborrheic  dermatitis.  Patient was directed to the QR code at the back of his AVS to facilitate this.  Patient was treated today with a long-term prescription for doxycycline to address possible concomitant cellulitis surrounding the hordeolum of the right upper eyelid as well as to limit recurrence of new styes.  Patient was also advised to use baby shampoo to wash his face twice daily regardless of presence of symptoms of styes.  I have provided with a low-dose topical steroid cream and ketoconazole 2% cream which I have advised him to mix together, 1:1, and apply twice daily for the next 10-14 days  or until symptoms resolve, whichever comes first.  Patient advised to use this cream anytime he begins to be concerned about another stye appearing.  Patient verbalized understanding of plan as instructed.  Conservative care recommended.  Emergency precautions advised.  Please see discharge instructions below for details of plan of care as provided to patient. ED Prescriptions     Medication Sig Dispense Auth. Provider   doxycycline (VIBRAMYCIN) 100 MG capsule Take 1 capsule (100 mg total) by mouth 2 (two) times daily. 60 capsule Theadora Rama Scales, PA-C   triamcinolone (KENALOG) 0.025 % cream Apply 1 Application topically 2 (two) times daily. Apply to affected area(s) on face twice daily 30 g Theadora Rama Scales, PA-C   ketoconazole (NIZORAL) 2 % cream Apply topically 2 (two) times daily. 30 g Theadora Rama Scales, PA-C      PDMP not reviewed this encounter.  Pending results:  Labs Reviewed - No data to display  Discharge Instructions:   Discharge Instructions      For treatment of your recurrent hordeolum which is likely infected at this time, I recommend you begin doxycycline.  I sent a prescription to your pharmacy, please take 1 tablet twice daily until your prescription is complete or until you are seen by ophthalmologist and advised to do otherwise.  I believe that your frequent  styes could possibly be due to rosacea or seborrheic dermatitis which are both inflammatory conditions.  Doxycycline is often used to treat these conditions which is the most likely reason why doxycycline worked so well the last time you were given this medication.  For treatment of seborrheic dermatitis, I recommend that you begin topical treatment with 2 medications as well, ketoconazole cream and triamcinolone cream.  Ketoconazole is an antifungal cream and triamcinolone is a steroid cream.  I would like for you to mix these 2 medications together and apply a very, very small amount to both eyelids twice daily, again either until your prescription runs out or until you are advised otherwise by an ophthalmologist and advised to do otherwise.  Doxycycline will address possible rosacea.  Please use the QR code at the end of this checkout summary to go to the appointment scheduling website for current health.  There you will be able to find names, contact information and schedule appointment with any [facility] provider; ophthalmology, dermatology, plastic surgery, primary care, etc.   Thank you for visiting [facility] today.  We appreciate the opportunity to participate in your care.    Disposition Upon Discharge:  Condition: stable for discharge home  Patient presented with an acute illness with associated systemic symptoms and significant discomfort requiring urgent management. In my opinion, this is a condition that a prudent lay person (someone who possesses an average knowledge of health and medicine) may potentially expect to result in complications if not addressed urgently such as respiratory distress, impairment of bodily function or dysfunction of bodily organs.   Routine symptom specific, illness specific and/or disease specific instructions were discussed with the patient and/or caregiver at length.   As such, the patient has been evaluated and assessed, work-up was  performed and treatment was provided in alignment with urgent care protocols and evidence based medicine.  Patient/parent/caregiver has been advised that the patient may require follow up for further testing and treatment if the symptoms continue in spite of treatment, as clinically indicated and appropriate.  Patient/parent/caregiver has been advised to return to the [facility] or PCP if no better; to PCP or  the Emergency Department if new signs and symptoms develop, or if the current signs or symptoms continue to change or worsen for further workup, evaluation and treatment as clinically indicated and appropriate  The patient will follow up with their current PCP if and as advised. If the patient does not currently have a PCP we will assist them in obtaining one.   The patient may need specialty follow up if the symptoms continue, in spite of conservative treatment and management, for further workup, evaluation, consultation and treatment as clinically indicated and appropriate.  Patient/parent/caregiver verbalized understanding and agreement of plan as discussed.  All questions were addressed during visit.  Please see discharge instructions below for further details of plan.  This office note has been dictated using Teaching laboratory technician.  Unfortunately, this method of dictation can sometimes lead to typographical or grammatical errors.  I apologize for your inconvenience in advance if this occurs.  Please do not hesitate to reach out to me if clarification is needed.      Theadora Rama Scales, PA-C 02/21/23 1041

## 2023-02-20 NOTE — ED Triage Notes (Signed)
Pt left the ED last night because he could not wait. Pt has stye on each eye lid . Rt ey eis draining both eyes are itching ,burn and are swollen. Pt reports he had this before and was treated  doxycyline which cleared up the styes.

## 2024-01-14 ENCOUNTER — Emergency Department (HOSPITAL_COMMUNITY)
Admission: EM | Admit: 2024-01-14 | Discharge: 2024-01-14 | Disposition: A | Discharge status: Home / Self Care | Payer: Self-pay | Attending: Emergency Medicine | Admitting: Emergency Medicine

## 2024-01-14 ENCOUNTER — Other Ambulatory Visit: Payer: Self-pay

## 2024-01-14 ENCOUNTER — Encounter (HOSPITAL_COMMUNITY): Payer: Self-pay | Admitting: *Deleted

## 2024-01-14 DIAGNOSIS — H6122 Impacted cerumen, left ear: Secondary | ICD-10-CM | POA: Insufficient documentation

## 2024-01-14 DIAGNOSIS — I10 Essential (primary) hypertension: Secondary | ICD-10-CM | POA: Insufficient documentation

## 2024-01-14 MED ORDER — OFLOXACIN 0.3 % OP SOLN
5.0000 [drp] | Freq: Two times a day (BID) | OPHTHALMIC | Status: DC
  Administered 2024-01-14: 5 [drp] via OTIC
  Filled 2024-01-14: qty 5

## 2024-01-14 MED ORDER — ACETAMINOPHEN 325 MG PO TABS
650.0000 mg | ORAL_TABLET | Freq: Once | ORAL | Status: AC
  Administered 2024-01-14: 650 mg via ORAL
  Filled 2024-01-14: qty 2

## 2024-01-14 MED ORDER — CARBAMIDE PEROXIDE 6.5 % OT SOLN
10.0000 [drp] | Freq: Once | OTIC | Status: AC
  Administered 2024-01-14: 10 [drp] via OTIC
  Filled 2024-01-14: qty 15

## 2024-01-14 MED ORDER — KETOROLAC TROMETHAMINE 15 MG/ML IJ SOLN
15.0000 mg | Freq: Once | INTRAMUSCULAR | Status: AC
  Administered 2024-01-14: 15 mg via INTRAMUSCULAR
  Filled 2024-01-14: qty 1

## 2024-01-14 NOTE — ED Triage Notes (Signed)
 The pt s lt ear is clogged for 2 days and his hearing is getting worse

## 2024-01-14 NOTE — Discharge Instructions (Addendum)
 It was a pleasure taking part in your care.  As discussed, I am sending you home with antibiotic eardrops.  Please place 5 drops in your left ear 5 times a day.  Please read the attached guide concerning ear irrigation as well as earwax buildup.  Please follow-up with Dr. Westley Hammers of ENT.  Return to the ED with any new or worsening symptoms.

## 2024-01-14 NOTE — ED Provider Notes (Addendum)
 [place] [facility] Provider Note   CSN: [id number] Arrival date & time: 01/14/24  0145     History  Chief Complaint  Patient presents with   Ear Fullness    Paul Osborne is a 48 y.o. male with a history of hypertension and impacted cerumen.  He presents to the ED for evaluation of left ear fullness.  Reports that for the last 2 days he has had progressively worsening fullness in his left ear.  He reports that last night he was able to extract a large amount of earwax from the ear but reports that his ear fullness persists. He states that he was attempting to remove the earwax with some kind of "scooping" device.  He states that during the course of doing this, he began to have pain in his ear.  He reports history of impacted cerumen 1 year ago and was treated in the ED.  He never followed up with ENT.  He denies wearing ear buds.  Denies any dizziness, nausea or vomiting.  Denies fevers at home.  Denies history of diabetes.  Denies drainage from the ear. He does endorse muffled hearing.   Ear Fullness       Home Medications Prior to Admission medications   Not on File      Allergies    Patient has no known allergies.    Review of Systems   Review of Systems  Constitutional:  Negative for fever.  HENT:  Positive for ear pain and hearing loss. Negative for ear discharge.   Neurological:  Negative for dizziness.  All other systems reviewed and are negative.   Physical Exam Updated Vital Signs BP (!) 140/96 (BP Location: Right Arm)   Pulse 91   Temp 98 F (36.7 C)   Resp (!) 22   Ht 5\' 8"  (1.727 m)   Wt 79.4 kg   SpO2 100%   BMI 26.62 kg/m  Physical Exam Vitals and nursing note reviewed.  Constitutional:      General: He is not in acute distress.    Appearance: He is well-developed.  HENT:     Head: Normocephalic and atraumatic.     Left Ear: There is impacted cerumen.     Ears:     Comments: Tympanic membrane unable  to be appreciated due to impacted cerumen. Eyes:     Conjunctiva/sclera: Conjunctivae normal.  Cardiovascular:     Rate and Rhythm: Normal rate and regular rhythm.     Heart sounds: No murmur heard. Pulmonary:     Effort: Pulmonary effort is normal. No respiratory distress.     Breath sounds: Normal breath sounds.  Abdominal:     Palpations: Abdomen is soft.     Tenderness: There is no abdominal tenderness.  Musculoskeletal:        General: No swelling.     Cervical back: Neck supple.  Skin:    General: Skin is warm and dry.     Capillary Refill: Capillary refill takes less than 2 seconds.  Neurological:     Mental Status: He is alert.  Psychiatric:        Mood and Affect: Mood normal.     ED Results / Procedures / Treatments   Labs (all labs ordered are listed, but only abnormal results are displayed) Labs Reviewed - No data to display  EKG None  Radiology No results found.  Procedures Procedures   Medications Ordered in ED Medications  ofloxacin (OCUFLOX) 0.3 %  ophthalmic solution 5 drop (has no administration in time range)  ketorolac  (TORADOL ) 15 MG/ML injection 15 mg (has no administration in time range)  acetaminophen  (TYLENOL ) tablet 650 mg (has no administration in time range)  carbamide peroxide (DEBROX) 6.5 % OTIC (EAR) solution 10 drop (10 drops Left EAR Given 01/14/24 0239)    ED Course/ Medical Decision Making/ A&P  Medical Decision Making Risk OTC drugs. Prescription drug management.   48 year old presents for evaluation.  Please see HPI for further details.  On exam patient is afebrile and nontachycardic.  His lung sounds are clear bilaterally, he is not hypoxic.  Abdomen soft and compressible.  Neurological examination at baseline.    Left EAC with impacted cerumen.  Debrox was applied and the patient allowed this to sit for 15 minutes.  The patient then had his ears flushed with warm normal saline and a large amount of earwax expelled from the  ear.  After earwax was expelled, tympanic membrane attempted to be visualized however it does appear to be perforated.  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  He reports that this pain began last night when he was attempting to "scoop" material from his ear swab wonder if this occurred last night. His EAC does appear inflamed, erythematous so we will start patient on ofloxacin drops which he will apply to his left ear 5 drops twice daily.  He will also be given referral to ENT as he states that this is a recurrent issue.  After earwax was extracted, the patient reports that his fullness to decrease.  He was provided with ofloxacin drops and given a prescription here in the department.  He was provided with Toradol  and Tylenol  for pain.  He will follow-up with ENT.  He was given return precautions and he voiced understanding.  Stable to discharge.   Final Clinical Impression(s) / ED Diagnoses Final diagnoses:  Impacted cerumen of left ear    Rx / DC Orders ED Discharge Orders     None          Adel Aden, PA-C 01/14/24 0345    Lindle Rhea, MD 01/14/24 [phone]
